# Patient Record
Sex: Female | Born: 1970 | Hispanic: Refuse to answer | Smoking: Never smoker
Health system: Southern US, Community
[De-identification: ages and names within clinical notes are randomized; demographics above are authoritative.]

## PROBLEM LIST (undated history)

## (undated) DIAGNOSIS — E039 Hypothyroidism, unspecified: Secondary | ICD-10-CM

## (undated) DIAGNOSIS — F319 Bipolar disorder, unspecified: Secondary | ICD-10-CM

## (undated) DIAGNOSIS — L508 Other urticaria: Secondary | ICD-10-CM

## (undated) DIAGNOSIS — M5030 Other cervical disc degeneration, unspecified cervical region: Secondary | ICD-10-CM

## (undated) DIAGNOSIS — F419 Anxiety disorder, unspecified: Secondary | ICD-10-CM

## (undated) DIAGNOSIS — F32A Depression, unspecified: Secondary | ICD-10-CM

## (undated) DIAGNOSIS — J45909 Unspecified asthma, uncomplicated: Secondary | ICD-10-CM

## (undated) DIAGNOSIS — M5136 Other intervertebral disc degeneration, lumbar region: Secondary | ICD-10-CM

## (undated) DIAGNOSIS — M797 Fibromyalgia: Secondary | ICD-10-CM

## (undated) DIAGNOSIS — N200 Calculus of kidney: Secondary | ICD-10-CM

## (undated) DIAGNOSIS — G43909 Migraine, unspecified, not intractable, without status migrainosus: Secondary | ICD-10-CM

## (undated) DIAGNOSIS — L4050 Arthropathic psoriasis, unspecified: Secondary | ICD-10-CM

## (undated) DIAGNOSIS — M51369 Other intervertebral disc degeneration, lumbar region without mention of lumbar back pain or lower extremity pain: Secondary | ICD-10-CM

## (undated) DIAGNOSIS — I7300 Raynaud's syndrome without gangrene: Secondary | ICD-10-CM

## (undated) DIAGNOSIS — F410 Panic disorder [episodic paroxysmal anxiety] without agoraphobia: Secondary | ICD-10-CM

## (undated) DIAGNOSIS — F329 Major depressive disorder, single episode, unspecified: Secondary | ICD-10-CM

## (undated) DIAGNOSIS — R569 Unspecified convulsions: Secondary | ICD-10-CM

## (undated) HISTORY — PX: TONSILLECTOMY AND ADENOIDECTOMY: SUR1326

## (undated) HISTORY — DX: Anxiety disorder, unspecified: F41.9

## (undated) HISTORY — PX: CERVICAL SPINE SURGERY: SHX589

## (undated) HISTORY — PX: BREAST ENHANCEMENT SURGERY: SHX7

## (undated) HISTORY — DX: Major depressive disorder, single episode, unspecified: F32.9

## (undated) HISTORY — PX: OTHER SURGICAL HISTORY: SHX169

## (undated) HISTORY — DX: Depression, unspecified: F32.A

## (undated) HISTORY — PX: ABDOMINAL HYSTERECTOMY: SHX81

## (undated) HISTORY — PX: LUMBAR SPINE SURGERY: SHX701

## (undated) HISTORY — PX: PARTIAL HYSTERECTOMY: SHX80

## (undated) HISTORY — PX: RHINOPLASTY: SUR1284

---

## 1998-01-26 ENCOUNTER — Ambulatory Visit (HOSPITAL_COMMUNITY): Admission: RE | Admit: 1998-01-26 | Discharge: 1998-01-26 | Payer: Self-pay | Admitting: Specialist

## 2004-08-15 ENCOUNTER — Inpatient Hospital Stay (HOSPITAL_COMMUNITY): Admission: AD | Admit: 2004-08-15 | Discharge: 2004-08-17 | Payer: Self-pay | Admitting: Internal Medicine

## 2004-08-16 ENCOUNTER — Encounter (INDEPENDENT_AMBULATORY_CARE_PROVIDER_SITE_OTHER): Payer: Self-pay | Admitting: Specialist

## 2009-01-11 ENCOUNTER — Observation Stay (HOSPITAL_COMMUNITY): Admission: EM | Admit: 2009-01-11 | Discharge: 2009-01-29 | Payer: Self-pay | Admitting: Internal Medicine

## 2010-10-16 LAB — BASIC METABOLIC PANEL
BUN: 5 mg/dL — ABNORMAL LOW (ref 6–23)
BUN: 7 mg/dL (ref 6–23)
CO2: 30 mEq/L (ref 19–32)
CO2: 30 mEq/L (ref 19–32)
CO2: 31 mEq/L (ref 19–32)
Calcium: 8.9 mg/dL (ref 8.4–10.5)
Calcium: 9.4 mg/dL (ref 8.4–10.5)
Chloride: 96 mEq/L (ref 96–112)
Chloride: 93 mEq/L — ABNORMAL LOW (ref 96–112)
Creatinine, Ser: 0.67 mg/dL (ref 0.4–1.2)
Creatinine, Ser: 0.77 mg/dL (ref 0.4–1.2)
GFR calc Af Amer: >60 mL/min (ref >60)
GFR calc Af Amer: >60 mL/min (ref >60)
GFR calc non Af Amer: >60 mL/min (ref >60)
Glucose, Bld: 115 mg/dL — ABNORMAL HIGH (ref 70–99)
Glucose, Bld: 118 mg/dL — ABNORMAL HIGH (ref 70–99)
Potassium: 4.2 mEq/L (ref 3.5–5.1)
Sodium: 124 mEq/L — ABNORMAL LOW (ref 135–145)

## 2010-10-16 LAB — DIFFERENTIAL
Basophils Absolute: 0 10*3/uL (ref 0.0–0.1)
Basophils Relative: 0 % (ref 0–1)
Eosinophils Absolute: 0.2 10*3/uL (ref 0.0–0.7)
Monocytes Absolute: 0.6 10*3/uL (ref 0.1–1.0)
Monocytes Relative: 9 % (ref 3–12)
Neutro Abs: 4.2 10*3/uL (ref 1.7–7.7)
Neutrophils Relative %: 64 % (ref 43–77)

## 2010-10-16 LAB — CBC
HCT: 31.1 % — ABNORMAL LOW (ref 36.0–46.0)
HCT: 35.8 % — ABNORMAL LOW (ref 36.0–46.0)
Hemoglobin: 10.2 g/dL — ABNORMAL LOW (ref 12.0–15.0)
MCHC: 33.7 g/dL (ref 30.0–36.0)
MCHC: 34.4 g/dL (ref 30.0–36.0)
MCV: 86.1 fL (ref 78.0–100.0)
MCV: 86.1 fL (ref 78.0–100.0)
MCV: 86.8 fL (ref 78.0–100.0)
Platelets: 155 10*3/uL (ref 150–400)
Platelets: 182 10*3/uL (ref 150–400)
RBC: 3.61 MIL/uL — ABNORMAL LOW (ref 3.87–5.11)
RBC: 4.13 MIL/uL (ref 3.87–5.11)
RDW: 13.4 % (ref 11.5–15.5)
RDW: 13.4 % (ref 11.5–15.5)
WBC: 6.5 10*3/uL (ref 4.0–10.5)

## 2010-10-16 LAB — COMPREHENSIVE METABOLIC PANEL
ALT: 16 U/L (ref 0–35)
ALT: 28 U/L (ref 0–35)
ALT: 34 U/L (ref 0–35)
AST: 10 U/L (ref 0–37)
AST: 13 U/L (ref 0–37)
AST: 14 U/L (ref 0–37)
Albumin: 3.2 g/dL — ABNORMAL LOW (ref 3.5–5.2)
Albumin: 3.3 g/dL — ABNORMAL LOW (ref 3.5–5.2)
Albumin: 3.6 g/dL (ref 3.5–5.2)
Albumin: 3.8 g/dL (ref 3.5–5.2)
Albumin: 4 g/dL (ref 3.5–5.2)
Albumin: 4.1 g/dL (ref 3.5–5.2)
Alkaline Phosphatase: 50 U/L (ref 39–117)
Alkaline Phosphatase: 65 U/L (ref 39–117)
Alkaline Phosphatase: 72 U/L (ref 39–117)
BUN: 6 mg/dL (ref 6–23)
BUN: 10 mg/dL (ref 6–23)
BUN: 4 mg/dL — ABNORMAL LOW (ref 6–23)
BUN: 4 mg/dL — ABNORMAL LOW (ref 6–23)
BUN: 7 mg/dL (ref 6–23)
BUN: 9 mg/dL (ref 6–23)
CO2: 26 mEq/L (ref 19–32)
CO2: 29 mEq/L (ref 19–32)
CO2: 30 mEq/L (ref 19–32)
CO2: 30 mEq/L (ref 19–32)
Calcium: 8.7 mg/dL (ref 8.4–10.5)
Calcium: 8.7 mg/dL (ref 8.4–10.5)
Chloride: 92 mEq/L — ABNORMAL LOW (ref 96–112)
Chloride: 102 mEq/L (ref 96–112)
Chloride: 92 mEq/L — ABNORMAL LOW (ref 96–112)
Chloride: 92 mEq/L — ABNORMAL LOW (ref 96–112)
Chloride: 93 mEq/L — ABNORMAL LOW (ref 96–112)
Chloride: 95 mEq/L — ABNORMAL LOW (ref 96–112)
Chloride: 99 mEq/L (ref 96–112)
Creatinine, Ser: 0.63 mg/dL (ref 0.4–1.2)
Creatinine, Ser: 0.65 mg/dL (ref 0.4–1.2)
Creatinine, Ser: 0.7 mg/dL (ref 0.4–1.2)
Creatinine, Ser: 0.73 mg/dL (ref 0.4–1.2)
Creatinine, Ser: 0.81 mg/dL (ref 0.4–1.2)
Creatinine, Ser: 0.86 mg/dL (ref 0.4–1.2)
GFR calc Af Amer: >60 mL/min (ref >60)
GFR calc Af Amer: >60 mL/min (ref >60)
GFR calc non Af Amer: >60 mL/min (ref >60)
GFR calc non Af Amer: >60 mL/min (ref >60)
GFR calc non Af Amer: >60 mL/min (ref >60)
GFR calc non Af Amer: >60 mL/min (ref >60)
GFR calc non Af Amer: >60 mL/min (ref >60)
GFR calc non Af Amer: >60 mL/min (ref >60)
Glucose, Bld: 96 mg/dL (ref 70–99)
Glucose, Bld: 108 mg/dL — ABNORMAL HIGH (ref 70–99)
Glucose, Bld: 88 mg/dL (ref 70–99)
Potassium: 2.9 mEq/L — ABNORMAL LOW (ref 3.5–5.1)
Potassium: 4 mEq/L (ref 3.5–5.1)
Potassium: 4.2 mEq/L (ref 3.5–5.1)
Potassium: 4.2 mEq/L (ref 3.5–5.1)
Sodium: 131 mEq/L — ABNORMAL LOW (ref 135–145)
Sodium: 130 mEq/L — ABNORMAL LOW (ref 135–145)
Sodium: 133 mEq/L — ABNORMAL LOW (ref 135–145)
Total Bilirubin: 0.3 mg/dL (ref 0.3–1.2)
Total Bilirubin: 0.3 mg/dL (ref 0.3–1.2)
Total Bilirubin: 0.4 mg/dL (ref 0.3–1.2)
Total Bilirubin: 0.4 mg/dL (ref 0.3–1.2)
Total Bilirubin: 0.4 mg/dL (ref 0.3–1.2)
Total Bilirubin: 0.5 mg/dL (ref 0.3–1.2)
Total Bilirubin: 0.5 mg/dL (ref 0.3–1.2)
Total Protein: 5.7 g/dL — ABNORMAL LOW (ref 6.0–8.3)
Total Protein: 5.8 g/dL — ABNORMAL LOW (ref 6.0–8.3)

## 2010-10-16 LAB — OSMOLALITY, URINE: Osmolality, Ur: 318 mOsm/kg — ABNORMAL LOW (ref 390–1090)

## 2010-10-16 LAB — HEPATITIS B E ANTIBODY: Hep B E Ab: NEGATIVE

## 2010-10-16 LAB — H. PYLORI ANTIBODY, IGG: H Pylori IgG: <0.4 {ISR}

## 2010-10-16 LAB — URINE MICROSCOPIC-ADD ON

## 2010-10-16 LAB — URINALYSIS, ROUTINE W REFLEX MICROSCOPIC
Bilirubin Urine: NEGATIVE
Bilirubin Urine: NEGATIVE
Bilirubin Urine: NEGATIVE
Glucose, UA: NEGATIVE mg/dL
Hgb urine dipstick: NEGATIVE
Ketones, ur: NEGATIVE mg/dL
Ketones, ur: NEGATIVE mg/dL
Nitrite: NEGATIVE
Nitrite: NEGATIVE
Specific Gravity, Urine: 1.005 (ref 1.005–1.030)
Specific Gravity, Urine: 1.014 (ref 1.005–1.030)
Specific Gravity, Urine: 1.021 (ref 1.005–1.030)
Urobilinogen, UA: 0.2 mg/dL (ref 0.0–1.0)
Urobilinogen, UA: 0.2 mg/dL (ref 0.0–1.0)
pH: 6 (ref 5.0–8.0)
pH: 7.5 (ref 5.0–8.0)

## 2010-10-16 LAB — URINE CULTURE
Colony Count: NO GROWTH
Colony Count: NO GROWTH
Special Requests: POSITIVE

## 2010-10-16 LAB — HEMOGLOBIN A1C: Mean Plasma Glucose: 111 mg/dL

## 2010-10-16 LAB — AMYLASE
Amylase: 151 U/L — ABNORMAL HIGH (ref 27–131)
Amylase: 176 U/L — ABNORMAL HIGH (ref 27–131)

## 2010-10-16 LAB — TSH: TSH: 1.389 u[IU]/mL (ref 0.350–4.500)

## 2010-10-16 LAB — THYROID PEROXIDASE ANTIBODY: Thyroperoxidase Ab SerPl-aCnc: 47 U/mL (ref 0.0–60.0)

## 2010-10-16 LAB — LIPASE, BLOOD
Lipase: 55 U/L (ref 11–59)
Lipase: 90 U/L — ABNORMAL HIGH (ref 11–59)

## 2010-10-16 LAB — IRON AND TIBC
Iron: 102 ug/dL (ref 42–135)
Saturation Ratios: 33 % (ref 20–55)
TIBC: 312 ug/dL (ref 250–470)
UIBC: 210 ug/dL

## 2010-10-16 LAB — C1 ESTERASE INHIBITOR, FUNCTIONAL: C1INH Functional/C1INH Total MFr SerPl: 95 % (ref >=68)

## 2010-10-16 LAB — C4 COMPLEMENT: Complement C4, Body Fluid: 15 mg/dL — ABNORMAL LOW (ref 16–47)

## 2010-10-16 LAB — SODIUM, URINE, RANDOM: Sodium, Ur: 32 mEq/L

## 2010-10-16 LAB — LIPID PANEL: Triglycerides: 82 mg/dL (ref <150)

## 2010-10-16 LAB — EXTRACTABLE NUCLEAR ANTIGEN ANTIBODY: SSB (La) (ENA) Antibody, IgG: <0.2 AI (ref <1.0)

## 2010-10-16 LAB — HIV ANTIBODY (ROUTINE TESTING W REFLEX): HIV: NONREACTIVE

## 2010-10-16 LAB — ANTI-DNA ANTIBODY, DOUBLE-STRANDED: ds DNA Ab: <1 IU/mL (ref <5)

## 2010-10-16 LAB — SEDIMENTATION RATE: Sed Rate: 7 mm/hr (ref 0–22)

## 2010-10-16 LAB — ROCKY MTN SPOTTED FVR AB, IGM-BLOOD: RMSF IgM: 0.44 IV (ref 0.00–0.89)

## 2010-10-16 LAB — ROCKY MTN SPOTTED FVR AB, IGG-BLOOD: RMSF IgG: 0.18 IV

## 2010-10-16 LAB — CYTOMEGALOVIRUS ANTIBODY, IGG: Cytomegalovirus(CMV) Antibody, IgG: NEGATIVE

## 2010-11-22 NOTE — Consult Note (Signed)
[name], [name]          ACCOUNT NO.:  [id number]   MEDICAL RECORD NO.:  [id number]          PATIENT TYPE:  INP   LOCATION:  3019                         FACILITY:  [facility]   PHYSICIAN:  Casimiro Needle L. Reynolds, M.D.DATE OF BIRTH:  1970-07-22   DATE OF CONSULTATION:  01/12/2009  DATE OF DISCHARGE:                                 CONSULTATION   CHIEF COMPLAINT:  Weakness and decreased sensation on bilateral upper  extremities.   REFERRING PHYSICIAN:  Eric L. August Saucer, MD   HISTORY OF PRESENT ILLNESS:  This is a pleasant Caucasian 40 year old  female with a chief complaint of increased weakness and urticaria in the  right upper extremity and left upper extremity.  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 August Luz in [facility]  Reason for consultation today is left upper extremity weakness  and bilateral hypesthesia in the wrist.  The patient states that this  pain and discomfort started approximately 2 years ago without any known  etiology.  The patient works as a Geologist, engineering in [employer] and  does a lot of lifting and pulling on a daily basis.  She states that the  discomfort started in the left neck region and radiated to both the left  occiput and left trapezius.  Over time the patient started to notice  that bilaterally with left greater than right.  She had hand  hypesthesia.  It was prominently in the palms and the tips of her  fingers.  She states that there was no specific dermatomal distribution  to which this hypesthesia occurred and no specific position to which she  put her arms or her neck that caused this sensation.  Over the past 3  months, the patient has started to also drop objects, again with her  left greater than her right and noted that she was having difficulty  opening bottles and cans.  It should be noted the patient is a right-  handed female.  Her main complaint today is the pain that is located in  the left  occiput, left neck, and left trapezial region.  She is not  complaining of any weakness with shoulder abduction, bicep flexion or  extension.  She does note that she is having difficulty with her grip  bilaterally and feels as though she has a burning sensation that is  located in the left upper arm region.   The patient states that in the past she has tried cervical traction.  She has tried acupuncture.  She is tried multiple muscle relaxants and  multiple visits to physical therapy without any resolution.   PAST MEDICAL HISTORY:  Depression, cholecystectomy in 2006 for biliary  dyskinesia, status post laparoscopic abdominal hysterectomy.  Her  ovaries are still intact.  She has had saline implants x2, last being in  1997, chronic urticaria, and asthma.   MEDICATIONS:  1. Xanax 1.5 mg t.i.d.  2. Sinequan 75 mg at bedtime.  3. Pepcid 20 mg b.i.d.  4. Loratadine 10 mg daily.  5. Morphine 15 mg b.i.d.  6. Protonix 40 mg daily.  7.  Restoril 30 mg at bedtime.   ALLERGIES:  SULFA, SERTRALINE, DIPHENHYDRAMINE, CONTRAST MEDIA, CODEINE,  CYMBALTA, LYRICA, and PERCOCET.   SOCIAL HISTORY:  The patient does not smoke, drink, or do illicit drugs.  She has 3 children.  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   REVIEW OF SYSTEMS:  Negative.   PHYSICAL EXAMINATION:  VITAL SIGNS:  Blood pressure is 103/69, pulse 66,  respiratory rate 18, and temperature 98.1.  MENTAL STATUS:  She is alert, she is oriented x3.  She carries out 2 and  3 step commands without any difficulty.  PULMONARY:  Clear to auscultation bilaterally.  CARDIOVASCULAR:  S1 and S2.  Regular rate and rhythm.  NECK:  Negative for bruits and supple.  ABDOMEN:  Soft, nontender, and nondistended.  Bowel sounds in all 4  quadrants.  NEUROLOGIC:  Cranial Nerves:  Pupils are equal, round, reactive, and  accommodating to light.  Conjugate gaze.  Extraocular muscles are  intact.  Visual fields intact.  Face is symmetric.   Tongue is midline.  Uvula is midline.  Sensation bilaterally V1 through V3 is full to  pinprick and light touch.  Shoulder shrug, head turn, full range of  motion.  The patient does state propping her head to the right, does  cause slight discomfort in her right arm and propping her head to the  left causes slight discomfort in her left arm, at this point, right is  greater than left.  The patient has a negative Lhermitte sign and  negative Tinel sign, and negative Ballance sign.  Coordination finger-to-  nose smooth, heal-to-shin smooth, fine motor movements are equal  bilaterally.  Gait is within normal limits.  MOTOR:  She has 5/5 strength globally.  No tremor or asterixis.  No  muscle wasting, no decreased tone throughout.  The patient's tone and  bulk are within normal limits.   Drift negative bilaterally.   Deep tendon reflexes are 2+ throughout.  Downgoing toes bilaterally.   Sensation subjectively, she has decreased sensation bilateral palm  greater in the distal aspects of her fingers.  She also has decreased  sensation circumferentially around bilateral wrist.  Otherwise, she has  normal pinprick, vibration, proprioception, stereognosis, or  graphesthesia.   LABORATORY DATA:  ESR is 7, TSH 1.389.  Anemia study all showed within  normal limits.  Hep C was negative.  C3 complement was 77, C4 complement  15.  CRP was negative.  UA shows positive for nitrites, but no bacteria.  Sodium 133, potassium 2.9, chloride 99, CO2 26, BUN 4, creatinine 0.86,  glucose 100.  White blood cell count 6.5, platelets 155, hemoglobin and  hematocrit 10.6 and 31.1.  Rheumatoid factor negative.  ANA negative.   IMAGING AND TEST:  MRI of the brain shows normal study for the patient's  age.  MRI of C-spine shows C4-5 large right foraminal disk extrusion  complex with severe right C5 foraminal stenosis.  No cervical spine  stenosis noted.   IMPRESSION:  At this time, this is a 40 year old female  with neck pain,  most likely musculoskeletal.  However, the patient does have C4-5  herniated nucleus pulposus, but no clinical evidence of radicular pain.  At this time, we will recommend Zanaflex 2 mg at bedtime and after 1  week, increase 2 mg b.i.d.  PT for eval and treat and neurosurgical  evaluation, we will follow up p.r.n.     ______________________________  Felicie Morn, PA-C  Michael L. Thad Ranger, M.D.  Electronically Signed    DS/MEDQ  D:  01/12/2009  T:  01/13/2009  Job:  [id number]   cc:   Minerva Areola L. August Saucer, M.D.  Michael L. Thad Ranger, M.D.

## 2010-11-22 NOTE — Consult Note (Signed)
[name], SCHREIER          ACCOUNT NO.:  [id number]   MEDICAL RECORD NO.:  [id number]          PATIENT TYPE:  INP   LOCATION:  NA                           [facility]ACILITY[facility]  [facility]   PHYSICIAN:  Antonietta Breach, M.D.  DATE OF BIRTH:  [birth year]   DATE OF CONSULTATION:  01/25/2009  DATE OF DISCHARGE:                                 CONSULTATION   Ms. Murrill paged and roamed all night.  She did not sleep.  She has  been experiencing euphoric mood and some racing thoughts.  She is  redirectable and cooperative.  She is not combative.  She is not having  any hallucinations.   Her orientation and memory function are intact.  She states that her  mood is giddy.   She still has muscle tension and feeling on edge.  Please see the  previous discussion.   REVIEW OF SYSTEMS:  CARDIOVASCULAR:  Her EKG QTC was less than 500 ms.  NEUROLOGIC:  She is not having any stiffness or other extrapyramidal  side effects with the Seroquel.   LABORATORY DATA:  Amylase is elevated at 179, lipase is elevated at 90.  WBC 11.7.   The case was discussed with staff.   EXAMINATION:  VITAL SIGNS:  Temperature 97.5, pulse 82, respiratory rate  16, blood pressure 128/66, O2 saturation on room air 99%.  MENTAL STATUS EXAM:  Ms. Buttrey is alert.  Her eye contact is intact.  Her attention span is mildly decreased.  She does interrupt often.  Her  concentration is mildly decreased.  Her affect is mildly expansive.  Mood is euphoric.  She has intact memory and orientation function.  Her  speech is mildly pressured, increased speed.  Thought process - she has  some grandiosity.  Her insight is intact.  Her judgment is still  impaired for self-care outside the hospital.   ASSESSMENT:  AXIS I:  1. 293.84 - anxiety disorder not otherwise specified.  2. 296.80 - bipolar disorder, manic.   RECOMMENDATIONS:  1. Would increase the Seroquel to 200 mg q.h.s. for anti-psychosis,      anti-mania and mood  stabilization.  Would continue to monitor for      stiffness or other extrapyramidal side effects.  2. Would check an EKG again and discontinue the Seroquel if the QTC is      greater than 500 ms.  3. Would increase her Xanax back to 1 mg t.i.d. with it dose at q.h.s.  4. Would discontinue using Remeron since it may be contributing to her      persistent manic symptoms.  5. Would continue Restoril 30 mg q.h.s. p.r.n. insomnia one hour after      her q.h.s. medicines.  6. Transfer to a medical psychiatric unit would be ideal at this      point, given her ongoing general medical conditions and her      comorbid psychiatric symptoms.   If she is cleared from a general medical perspective, she would be a  candidate for an inpatient psychiatric unit.      Antonietta Breach, M.D.  Electronically Signed     JW/MEDQ  D:  01/25/2009  T:  01/25/2009  Job:  [id number]

## 2010-11-22 NOTE — Consult Note (Signed)
[name], [name]          ACCOUNT NO.:  [id number]   MEDICAL RECORD NO.:  [id number]          PATIENT TYPE:  INP   LOCATION:  3019                         FACILITY:  [facility]   PHYSICIAN:  Zenovia Jordan, MD      DATE OF BIRTH:  06-19-1971   DATE OF CONSULTATION:  01/13/2009  DATE OF DISCHARGE:                                 CONSULTATION   HISTORY OF PRESENT ILLNESS:  A 40 year old female with past medical  history of asthma, psoriasis diagnosed at age 14, fibromyalgia, allergic  rhinitis and chronic urticaria diagnosed 8 years ago based on skin  biopsy; now in [facility] with main concern of weakness and  urticaria.  Episode of urticaria started at the beginning of June 2010.  It has been getting slightly better.  No specific aggravating factors,  associated with occasional shortness of breath.  She has tried numerous  agents such as Zyrtec and Xylol with moderate success.  Urticaria mostly  occurs on the back area and legs.  She describes hives as round, red,  fire-like burning sensation, itching, about a half centimeter in size  and getting bigger, worse with movement and activity.  Last similar  episode was about 3 years ago which lasted 3 to 4 months.  She has had a  total of 4 to 5 episodes since her diagnosis in 2002.  In terms of her  weakness, mostly upper extremity and associated with numbness and  tingling.  The patient was recently started on Lyrica at the end of May  2010.  The patient also reports dysphagia, dry mouth, occasional oral  ulcers, mostly provoked by stress and generalized aches, fingers turning  cold and blue.   ALLERGIES:  SULFA, CITRULLINE, DIPHENHYDRAMINE, CONTRAST, CODEINE,  PERCOCET, ZOLOFT, LYRICA, CYMBALTA, LEXAPRO - all medicines give hives.   HOME MEDICATIONS:  Claritin, Xyzal, Prevacid, Vistaril, Elocon, Advair,  Albuterol, Xopenex, Epi-Pen, MSIR 15 mg twice daily, Ativan and Soma.   FAMILY MEDICAL HISTORY:  Psoriasis on dad's  side.   SOCIAL HISTORY:  Nonsmoker, nondrinker.  Nurse.  Currently unemployed.  Divorced, from [place].   PAST MEDICAL HISTORY:  Per HPI.  In addition.  1. GERD.  2. Cholecystectomy secondary to biliary dyskinesia in 2006.  3. Hysterectomy.  4. Depression.  5. Bipolar disorder.   REVIEW OF SYSTEMS:  Generalized weakness, joint pain, specifically  wrists, arms, neck, hips.   PHYSICAL EXAMINATION:  VITALS:  Temperature 97.8, blood pressure 114/70,  pulse 80, respirations 16, saturating 100% on room air.  GENERAL:  The patient is sitting in bed not in acute distress.  HEENT:  PERRLA.  EOMI.  No scleral icterus.  No conjunctival pallor.  No  oral ulcers or pharyngeal erythema.  Moist mucous membranes.  No visible  nasal polyps.  NECK:  No palpable lymphadenopathy.  No thyroid enlargement.  LUNGS:  Clear to auscultation bilaterally.  No wheezing.  CARDIOVASCULAR SYSTEM:  Regular rate and rhythm.  S1, S2 present.  No  murmurs.  ABDOMEN:  Soft, nondistended, nontender.  No guarding, no rebound.  Bowel sounds present.  SKIN:  Multiple lesions on the back,  arms, a few on the lower  extremities with excoriation, somewhat round, half centimeter to one  centimeter in diameter with red border and yellowish to whitish central  area.  Tender to palpation.  Chronic scratching marks.  MUSCULOSKELETAL SYSTEM:  No joint edema or effusions on upper or lower  extremities bilaterally.  Full range of motion throughout.  Tenderness  to palpation in MCP joints bilaterally, right more than left.  No  redness over the joints.  No warmth to touch.  NEUROLOGIC EXAM:  Alert, oriented x3.  Strength 5/5 throughout in lower  extremities, decreased strength in upper extremities.  Sensation intact  as well as touch.  Overall nonfocal exam.   LABS:  Sodium 137, potassium 4.0, chloride 102, bicarb 29, BUN 4,  creatinine 0.81, glucose 88.  WBC 6.8, hemoglobin 12.1, hematocrit 35.8,  platelets 182.  T bili  0.3, alkaline phosphatase 63, AST 24, ALT 37,  total protein 6.7, albumin 3.8, calcium 9.1.  Anti-DNA less than 1.  ANA  negative.  ESR 7.  CRP 0.  HIV nonreactive.  TSH 1.389.  Hepatitis C  negative.  RF less than 20.  C3 77 (88-201 normal range).  C4 15 (16-47  normal range).  UA positive for nitrites.  Urine culture negative.  Chest x-ray January 11, 2009:  No active disease.  MRI of the head with and  without contrast January 11, 2009:  Normal appearance of the brain.  MRI of  the cervical spine with and without contrast January 11, 2009:  Large right  foraminal extrusion disk osteophyte, C5 neuroforaminal stenosis present.   ASSESSMENT/PLAN:  A 40 year old female with diffuse fibromyalgia with  tender points and complaints.  Anxiety, almost manic in conversation.  A  few lesions on the back that have extensive excoriations.  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  Will try to  get records from prior allergist/immunologist.  Possible flare this time  due to Lyrica given for fibromyalgia pain.  Patient also with diffuse  fibromyalgia symptoms that complicate history.  I will start a  Prednisone taper 40 mg p.o. Prednisone daily for 2 days, then 30 mg  tablet p.o. for 2 days, then 20 mg tablet p.o. daily for one week, then  15 mg tablet p.o. daily for one week, then 10 mg tablet daily for one  week, then 5 mg tablet p.o. daily for one week and then stop.  The  patient can follow up with me as an outpatient in the next month or so.  She also will try and find an urticaria expert in the area.  As for her  fibromyalgia, she has tried and failed multiple medications secondary to  lack of efficacy or hives and bad reaction.  I recommend nonmedical  treatment - acupuncture, exercise, physical therapy.  Discussed  narcotics make fibromyalgia patients more sensitive to pain and should  be avoided,  particularly in this patient who states, It doesn't matter  if I'm addicted as I will have this forever.  Continue muscle  relaxants, Tramadol, sleeping medication.  Continue antihistamines and H-  2 blocker.  Call my office for an appointment upon discharge.   Thank for this interesting consult, please call with further questions.  Over 45 minutes spent on consultation in total.      Mliss Sax, MD  Electronically Signed      Zenovia Jordan, MD  Electronically Signed  IM/MEDQ  D:  01/13/2009  T:  01/13/2009  Job:  [id number]   cc:   Zenovia Jordan, MD

## 2010-11-22 NOTE — H&P (Signed)
[name], [name]          ACCOUNT NO.:  [id number]   MEDICAL RECORD NO.:  [id number]          PATIENT TYPE:  INP   LOCATION:  3019                         FACILITY:  [facility]   PHYSICIAN:  Eric L. August Saucer, M.D.     DATE OF BIRTH:  07-03-1971   DATE OF ADMISSION:  01/11/2009  DATE OF DISCHARGE:                              HISTORY & PHYSICAL   CHIEF COMPLAINT:  Refractory urticaria, progressive upper extremity  weakness, and anorexia.   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  The patient has history of chronic urticaria for greater  than 5 years.  She had been treated by her allergist in [facility], a  Dr. Rush Barer.  She has had skin biopsies which confirmed a chronic  urticaria with associated urticaria vasculitis.  There was a question of  possible cold induced urticaria as well.  The patient had been on a  number of agents with moderate success.  She, however, on December 20, 2008  developed an upper respiratory infection with a temperature spiking to  103.  This was followed by an exacerbation of hives with associated  difficulty breathing and associated asthmatic bronchitis.  The patient  did undergo antibiotic therapy.  She also used her inhalers with gradual  improvement of her breathing.  She, however, has continued to have  recurrent urticaria on a daily basis.  She has taken several medications  which have caused excessive drowsiness as well.  The patient notably has  episodes that exacerbate at night.  They occur mostly on arms and legs.  She has occasionally had exacerbation of asthma during these attacks as  well.  She carries an EpiPen.   The patient also had increasing problems with upper extremity weakness  to the point of dropping objects when carrying pots and pans.  She notes  bilateral tingling in her hands as well as forearms.  She has been  diagnosed in the past as having fibromyalgia.  She also has had history  of bulging disk for which she has undergone several treatments including  Lyrica and Cymbalta.  These agents have exacerbated her hives as well.  She has also been on Lexapro and Zoloft with similar results.  The  patient currently has been unable to work due to increasing neck pain  and dysesthesias into her arms.  She most recently had been on MS SR per  her neurologist.  She has attempted to wean herself off this medication  to once a day.  She, however, continues to have significant neck pain.   The patient has had intermittent dysphagia.  She has not been eating  consistently with progressive weight loss.  She acknowledges ongoing  stressors as well.  With progression of the above-noted symptoms, the  patient is admitted at this time for further evaluation.   PAST MEDICAL HISTORY:  As noted above.  She has had a cholecystectomy in  2006 for biliary dyskinesia.  She is status post  laparoscopic abdominal  hysterectomy.  Ovaries are still intact.  This was in November 2009.  Status post T and A in 1988.  She has had saline implants x2 last being  in 1997.   The patient has had a broken nose x2.  Fracture of the second and fourth  fingers on the right in the past as well.   Past medical history otherwise remarkable for psoriasis since age of  [age].  She has had allergic rhinitis as well as asthma with  intermittent exacerbation as noted.   REVIEW OF SYSTEMS:  As noted above.  She has had three MRI scans in the  past documenting bulging disk.  This was felt to be associated with her  refractory neck pain and headaches as well.  The patient has been  evaluated by Dr. Maryella Shivers of [facility], neurologist.  She has had  nerve conduction studies as well.  She has not be given a specific  diagnosis by the patient's report.  No recent MRI scan of  the head.   ALLERGIES:  SULFA, CODEINE, and HYDROCODONE.  She is allergic to  [facility] and possibly IV CONTRAST as well.  The patient has had poor  tolerance to WINE, CHEESE, and STRAWBERRIES.  She is also allergic to  COLD WAX, i.e. which causes asthma exacerbation.   The patient has poorly tolerated LYRICA, CYMBALTA, LEXAPRO, and ZOLOFT  as noted as well.   CURRENT MEDICATIONS:  MS SR 15 mg b.i.d.  The patient has been taking  Claritin 10 mg b.i.d.  Briefly tried Xyzal once a day with incomplete  relief.  The patient has been on Prevacid of 30 mg b.i.d., Vistaril 25-  50 mg q.i.d., and Xanax 1 mg b.i.d.  Elocon cream for psoriasis.  She  was recently recommended to try Sinequan which she has not taken.  She  also has Advair 250/50 one puff b.i.d.  Ventolin and/or Xopenex inhaler  q.4 h. p.r.n. asthma attack and EpiPen as noted.   PHYSICAL EXAMINATION:  GENERAL:  She is a well-developed, slender white  female extremely anxious presently complaining of itching and hives.  VITAL SIGNS:  Blood pressure of 114/70, pulse of 77, respiratory rate of  16, temperature 98.6, and O2 sats 99% on room air.  HEENT:  Head:  Normocephalic and atraumatic without bruits.  Extraocular  muscles are intact.  Pupils notably right pupil is at 3 mm diameter,  left pupil at 2 mm.  Both pupils are reacting to light.  There is no  sinus tenderness.  TMs with decreased light reflex without erythematous  changes.  Throat:  Posterior pharyngeal erythema.  Good dental repair.  NECK:  Supple without enlarged thyroid.  No positive cervical nodes.  She does have prominent trapezoid muscles, left greater than right to  direct palpation.  This extends up into the scalp causing some headaches  of pressure.  LUNGS:  Clear to auscultation.  No E to A changes.  CARDIOVASCULAR:  Normal S1-S2.  No S3, S4, murmurs, or rubs.  ABDOMEN:  Bowel sounds are present.  No masses or tenderness.  Well-  healed surgical scars.   SKIN:  Notable for scattered excoriations in her back, arms, knees, and  legs.  No pustular or purulent changes noted.  NEUROLOGIC:  She is alert, oriented to person, place, and time.  Somewhat pressured speech.  She has mild dizziness to right lateral  gaze.  Slight left foot drift on confrontation.  DTRs are 1+ and  symmetric.  Strength is 4/5 bilaterally.  She does have positive Tinel  sign bilaterally as well.  Otherwise intact.   LABORATORY DATA:  CBC revealed WBC of 6500, hemoglobin 10.6, and  hematocrit of 31.1.  Normal differential and 3% eosinophils.  Electrolytes:  Sodium 133, potassium 2.9, chloride 99, CO2 26, BUN 4,  creatinine 0.86, and glucose 100.  SGOT 13, SGPT 13, total protein 6.1,  albumin 3.3 low, calcium 8.4, and magnesium 1.7.  Amylase 47.  Urinalysis:  PH of 7.5, specific gravity of 1.021, dipstick negative,  trace protein, and trace nitrates.   Chest x-ray shows clear lung fields.  Normal heart size.  Cervical spine  demonstrates normal alignment with disk space well preserved.  No  evidence for subluxation or soft tissue swelling.  Abdominal film shows  unremarkable bowel gas pattern as well.   IMPRESSION:  1. Chronic urticaria, poorly controlled.  This has been exacerbated by      multiple factors.  We will need to rule out reversible causes.  2. Upper extremity dysesthesias affecting the hands with increasing      weakness on a sporadic basis.  Rule out shoulder-hand syndrome with      atypical presentation.  Rule out foraminal stenosis versus other.      This notably has not been borne out by x-rays.  3. Rule out multiple sclerosis versus other.  4. Questionable history of fibromyalgia.  5. Hypokalemia, possibly symptomatic.  Rule out gastrointestinal      versus renal losses.  6. Malnutrition.  This is multifactorial.  7. Atypical headaches with visual disturbance.  Rule out intracranial      process.  8. History of bipolar disease.  Presently on no  medications for this      at this time.  She has responded well to Seroquel.  She has had      adverse reaction to other medications in the past.   PLAN:  The patient is admitted for further evaluation.  We will do a  comprehensive screening for urticaria.  We will also obtain stools for O  and P as well.  ANA, ENA, C3, C4, thyroid panel, hepatitis B and C has  been ordered.  We will obtain neurological opinion after an MRI scan of  her head and neck has been obtained.  We will also get an opinion from  Rheumatology regarding fibromyalgia.  We will now continue her on Xyzal  b.i.d. (high-dose).  We will place her on Sinequan at this time at  bedtime as well.  Pending results are her imaging studies.  We will have  Neurology and Physical Therapy evaluate the patient as well.  We will  replete electrolytes and encourage good nutrition.  Follow up pending  results of the above.           ______________________________  Lind Guest. August Saucer, M.D.     ELD/MEDQ  D:  01/12/2009  T:  01/12/2009  Job:  [id number]

## 2010-11-22 NOTE — Consult Note (Signed)
[name], [name]          ACCOUNT NO.:  [id number]   MEDICAL RECORD NO.:  [id number]          PATIENT TYPE:  INP   LOCATION:  3019                         FACILITY:  [facility]   PHYSICIAN:  Hewitt Shorts, M.D.DATE OF BIRTH:  Oct 12, 1970   DATE OF CONSULTATION:  01/14/2009  DATE OF DISCHARGE:                                 CONSULTATION   HISTORY OF PRESENT ILLNESS:  The patient is a 40 year old female who is  seen in the neurosurgical consultation on request of Dr. Willey Blade  regarding cervical spondylosis.   The patient was admitted on January 11, 2009, because of difficulties with  refractory urticaria and complaints of discomfort in her neck worse on  the left than the right side.  It can extend up into the occiput and  extend down around the scapula and down to the shoulders, arms, and  forearms bilaterally with some numbness and tingling in the distal upper  extremities.   The patient was admitted by Dr. Willey Blade and workup initiated including  MRI scan of the cervical spine, subsequently cervical spine x-rays were  done.  The patient has been seen in Neurology consultation as well as in  Rheumatology consultation by Dr. Zenovia Jordan and is also been seen in  Psychiatry consultation by Dr. Geralyn Flash.   The patient reports a long history of chronic pain and has been followed  and treated by a neurologist as well as other physicians in [facility], where she has lived.  She has been diagnosed by them with  fibromyalgia and has been treated with numerous medications, many of  which she has had a variety of side effects or reactions too.   The patient is originally from [place], [place].  Her parents  continue to live in this area and she returned home to be evaluated by  Dr. August Saucer (who is both of her parents' primary physician).   In addition to having being diagnosed with fibromyalgia, she has had a  history of chronic urticaria.  As part of her  treatment of chronic pain  and fibromyalgia, she has been treated with extensive amounts of variety  of narcotic medications.   PAST MEDICAL HISTORY:  Notable for chronic urticaria, fibromyalgia,  chronic pain, and psoriasis.   PAST SURGICAL HISTORY:  Previous surgeries include cholecystectomy,  hysterectomy, tonsillectomy and adenoidectomy, and breast implants.   ALLERGIES:  She reports side effects to numerous medications, documented  in the chart including SULFA, CODEINE, and HYDROCODONE, but also  intolerances to LYRICA, CYMBALTA, LEXAPRO, and ZOLOFT.   MEDICATIONS:  Extensive narcotic medications for her chronic pain as  well as Prevacid, Vistaril, Xanax, and Advair.   FAMILY HISTORY:  Parents, both are alive and well, but her mother does  have fibromyalgia and is followed by Dr. Pollyann Savoy as well as by  Dr. Willey Blade here.   SOCIAL HISTORY:  The patient is separated from her husband.  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  She describes 3 children who are healthy.  She does not smoke, drink  alcohol beverages,  and denies history of substance abuse.   REVIEW OF SYSTEMS:  Notable for those described in the history of  present illness and past medical history but is otherwise unremarkable.   PHYSICAL EXAMINATION:  GENERAL:  The patient is well-developed, well-  nourished white female in no acute distress.  VITAL SIGNS:  Temperature is 98.7, pulse 66, blood pressure 103/69.  LUNGS:  Clear to auscultation.  She has symmetrical respiratory  excursion.  HEART:  Regular rate and rhythm.  Normal S1 and S2.  There is no murmur.  EXTREMITIES:  No clubbing, cyanosis, or edema.  MUSCULOSKELETAL:  Good range of motion of neck and extremities.  NEUROLOGIC:  Shows 5/5 strength in the upper and lower extremities  including deltoid, biceps, triceps, intrinsics, grip, iliopsoas,  quadriceps, dorsiflexor, extensor hallucis longus, and plantar  flexor  bilaterally.  Sensation is intact to pinprick.  Reflexes are minimal in  the biceps, brachioradialis, triceps, quadriceps, and gastrocnemius are  symmetrical bilaterally.  Toes are downgoing bilaterally.   DIAGNOSTIC STUDIES:  MRI of the cervical spine done January 12, 2009, at  [facility] was reviewed, there is a small spur to the right at  C4-5, it does not cause any spinal cord or thecal sac compression.  Other than for that the cervical spine MRI is unremarkable.  This spur  was reconfirmed on oblique x-rays also done on January 12, 2009, at [facility].   IMPRESSION:  The patient with rather limited cervical spondylosis with a  small spur to the right at C4-5 not causing significant neuro  compression and causing no spinal cord or thecal sac compression with  little if any other degenerative changes seen in the cervical spine.   The patient has diffuse complaints of pain in the neck worse on the left  than the right.  Discomfort extending up into the occiput bilaterally  and down along the scapula and down to the upper extremities  bilaterally, again left worse than the right.  This is unrelated to the  minimal cervical spondylitic changes described above.   The patient is neurologically intact with good strength and sensation.  No evidence of myelopathy and radiologically, she has no significant  neuro compression.   RECOMMENDATIONS:  There is no indication for neurosurgical intervention.  I do not favor surgical intervention for this minimal cervical  spondylitic spur.  I do not believe that it is causing any of her  symptoms and I do not feel that she would benefit from any type of  neurosurgical intervention.  Further there is no indication for ongoing  neurosurgical followup and instead, I feel that she should continue to  work with Dr. Willey Blade, her local primary physician as well as to  follow up with Dr. Zenovia Jordan, the rheumatologist who saw her  here in  [facility] yesterday, as well as with her treating neurologists  in [place], [place].   I spoke with the patient at length.  Discussed her MRI and x-rays at  length with her and explained my recommendations and assessment to her,  and her questions were thoroughly answered.      Hewitt Shorts, M.D.  Electronically Signed     RWN/MEDQ  D:  01/14/2009  T:  01/15/2009  Job:  [id number]   cc:   Minerva Areola L. August Saucer, M.D.

## 2010-11-22 NOTE — Consult Note (Signed)
[name], [name]          ACCOUNT NO.:  [id number]   MEDICAL RECORD NO.:  [id number]          PATIENT TYPE:  INP   LOCATION:  3019                         FACILITY:  [facility]   PHYSICIAN:  Anselm Jungling, MD  DATE OF BIRTH:  03/07/1971   DATE OF CONSULTATION:  01/14/2009  DATE OF DISCHARGE:                                 CONSULTATION   IDENTIFYING DATA AND REASON FOR REFERRAL:  The patient is a 40 year old  married Caucasian female, who was usually works as a Geologist, engineering.  She is from the [place] area, but is currently staying with her  parents here in [place] and may move back here.  She is a patient of  Dr. August Saucer,  her family physician.  She is admitted to [facility]  due to severe and intractable urticaria and associated problems.  Psychiatric consultation is requested for general evaluation and  recommendations.   HISTORY AND PRESENTING PROBLEMS:  The following information is mostly  provided by the patient, who appears to be a very good historian.  She  has had a long history of chronic urticaria, hives, and a history of  poor response to treatment for these.  At present, she is being treated  with of a variety of antihistaminic medications and prednisone course.  She has had evaluations by rheumatology and carries associated diagnosis  of fibromyalgia.  She has also recently had gynecologic surgery,  including partial hysterectomy, with associated anemia and weight loss.   The patient states that she recently moved up to her parents here in  [place] because she was becoming severely depressed at her home in  [place].  She has recently learned that her husband had been having  an affair.  Also, due to her medical absences, she lost her job.   She has been treated with antidepressant medications in the past  including Lexapro and Zoloft, but states that she is allergic to them  and develops hives in response.  She has used Seroquel in the  past to  help her sleep, and she states that it has been very helpful.   At present, she is ordered Seroquel 50 mg q.h.s..  She is not on any  other psychotropics, except for some benzodiazepines for anxiety.   The patient is very open to any kind of help that I might offer at this  point.  She denies any history of psychiatric hospitalizations, and  aside from periods of depression, has not had any major psychiatric  disturbance in her lifetime.   MENTAL STATUS AND OBSERVATIONS:  The patient is a slender, but  adequately nourished, adult female who I interviewed in her hospital  room.  She is extremely pleasant, engageable, with bright affect, and a  sweet disposition in nature.  She appears to be a good historian.  She  is clearly of above average intelligence and education.  She is fairly  psychologically sophisticated.  She is a good historian.  Her thoughts  and speech are normally organized.  There is nothing to suggest any  significant underlying psychiatric disturbance.  She indicates that  although she  has been depressed lately, she has never had any thoughts  or impulses towards suicide.  She describes her family is very  supportive.  She has three children, ages 31, 29 and 42.  Apparently they  are all up here in [place] with her parents.   IMPRESSION:  The patient has an unusual history of symptoms, involving  recalcitrant urticaria, fibromyalgia, and depression, much of which may  be situational, in response to her current marital crisis, job loss, and  medical problems.  She has not been able to tolerate trials of an SSRI  antidepressants in the past.  She has benefited from Seroquel.   At this time, one might also entertain the diagnosis of neurodermatitis.  Although a delightful and bright individual who was a pleasure to talk  to, she does appear to be somewhat high-strung in nature, and there may  be partial psychogenic component to her difficulties.   DIAGNOSTIC  IMPRESSION:  Axis I:  Mood disorder not otherwise specified,  secondary to medical condition. Rule out neurodermatitis not otherwise  specified.  Axis II:  Deferred.  Axis III:  See medical notes.  Axis IV:  Stressors severe.  Axis V:  Global assessment of function  85.   RECOMMENDATIONS:  I discussed the possibility of some medication trials  with the patient.  Of first of all, she may benefit from higher doses of  Seroquel than she has been on in the past.  I am recommending an  increased to 100 mg.  Also, she might benefit from a trial of a  tricyclic antidepressant.  I am recommending initiation Elavil 25 mg  q.h.s.Marland Kitchen  I will discuss this with Dr. August Saucer on the telephone later today.  I discussed potential risks and benefits and side effects of a trial of  Elavil.   The patient agrees that she needs referral to a local psychiatrist for  ongoing follow-up of her medication treatment for depression, and/or  anxiety, as she may continue on some benzodiazepines as well.   I spoke with her about the possibility of coming to our intensive  outpatient psychiatric program for 2-3 weeks following her medical  stabilization, but this would depend on insurance preauthorization,  which may be questionable, since she is covered by Medicaid.   Nonetheless, I would be glad to follow her during her inpatient stay  here.  Dr. Antonietta Breach will return from his vacation and will be  available starting July 12 for ongoing psychiatric consultation.   Thank you for involving me in this patient's care.      Anselm Jungling, MD  Electronically Signed     SPB/MEDQ  D:  01/14/2009  T:  01/14/2009  Job:  [id number]

## 2010-11-22 NOTE — Consult Note (Signed)
[name], [name]          ACCOUNT NO.:  [id number]   MEDICAL RECORD NO.:  [id number]          PATIENT TYPE:  OBV   LOCATION:                               FACILITY:  [facility]   PHYSICIAN:  Michael L. Reynolds, M.D.DATE OF BIRTH:  01/29/2011   DATE OF CONSULTATION:  01/26/2009  DATE OF DISCHARGE:                                 CONSULTATION   CHIEF COMPLAINT:  Urinary retention, low back pain.   HISTORY OF PRESENT ILLNESS:  This is a patient who was previously  consulted by Dr. Thad Ranger back on January 12, 2009.  At that time the  consultation was for cervical pain and left arm numbness.  At that time  an MRI was obtained which showed a C4-5 large right foraminal disk  extrusion versus osteophyte complex.  On January 12, 2009 Neurology was  consulted.  At that time it was found that she was not a candidate for  surgery and they recommended physical therapy and stretching exercises.  Today's date is January 26, 2009.  At this time we were reconsulted for  this patient as over the last 2 days the patient has been complaining of  some urinary retention.  It was first noted on 01/23/2009 where nursing  notes that the patient was complaining of distended bladder and had only  voided one time throughout the day.  An in-and-out cath was placed, 320  mL of light yellow urine were expelled at that time.  On 01/25/2009 the  patient continued to complain of urinary retention.  The patient was  offered an in-and-out cath at that time, however the patient refused and  wanted a continuous Foley.  On January 26, 2009 after installation of the  Foley, 400 mL of urine was obtained upon insertion.  Due to question of  urinary retention an MRI of the patient's L-spine was obtained which  showed L4-5 to have a minimal disk bulge.  There was mild facet and  ligamentous prominence.  There was narrowing of the lateral recess  without gross neural compression.  At level L5-S1 disk is degenerated.  A shallow  broad-based herniation of disk material, however in the facet,  ligaments are hypertrophic.  There is stenosis of both lateral recesses.  S1 nerve root could be affected, however, the radiologist read out that  he did not see any neural compressive lesion that one would expect  resulting in urinary retention.   After speaking with the patient and the patient's mother, information  was obtained that the patient has had a urethral dilatation in the past  for previous episodes.   At present time the patient is comfortable, is able to answer all  questions, is alert and oriented,  She has an indwelling catheter in  place.  She has no complaints of bladder distention or back pain at this  time.   PAST MEDICAL HISTORY:  1. Depression.  2. Cholecystectomy in 2006 for biliary dyskinesia.  3. Status post laparoscopic abdominal hysterectomy.  The patient's      ovaries are still intact.  4. She has saline implants x2, last being in 1997.  5.  She has chronic urticaria.  6. Asthma.  7. Fibromyalgia.  8. Depression.  9. GERD.  10.Mood disorder.   MEDICATIONS:  The patient at this time is on:  1. Xanax.  2. Augmentin.  3. Soma.  4. Colchicine.  5. Colace.  6. Pepcid.  7. Diflucan.  8. Advair.  9. Claritin.  10.Morphine sulfate.  11.MS Contin 15 mg p.o. b.i.d.  12.Multivitamins.  13.Triple antibiotic applied topically b.i.d.  14.Protonix.  15.Pyridium.  16.MiraLax.  17.K-Dur.  18.Seroquel 200 mg nightly p.r.n.  19.P.r.n. medications include Xanax, Atarax, Ondansetron, Pyridium,      Phenergan, Senokot, Restoril and Ultram.   ALLERGIES:  INCLUDE SULFA, SERTRALINE, DIPHENHYDRAMINE, CONTRAST MEDIA,  CODEINE, PERCOCET, ROXICET, LYRICA.   SOCIAL HISTORY:  The patient does not smoke, drink or do illicit drugs.  She has three children.  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   REVIEW OF SYSTEMS:  Negative with the exception of  above.   PHYSICAL EXAM:  VITAL SIGNS:  Stable.  Temperature is 97.3, pulse is 89,  respirations 18, blood pressure is 117/72, O2 saturations 99.  MENTAL STATUS:  She is alert.  She is oriented x3.  She carries out two  and three-step commands without any difficulty.  She is well-enunciated,  well-articulated and has clear mentation.  PULMONARY:  Clear to auscultation bilaterally.  CARDIOVASCULAR:  S1, S2.  Regular rate and rhythm.  No murmurs, rubs or  gallops.  NECK:  Negative for bruits and supple.  ABDOMEN:  Soft, nontender, nondistended.  Bowel sounds in all quadrants.  NEUROLOGICAL:  Cranial nerves:  Pupils are equal, round, reactive,  accommodating to light.  She has conjugate gaze.  Extraocular muscles  are grossly intact.  Visual fields are grossly intact.  Face is  symmetrical.  Tongue is midline at this time, uvula is midline.  Her  sensation bilaterally of the face of the trigeminal nerve V1-V3 is full  to pinprick, light touch and vibration.  Shoulder shrug, head turn full  range of motion.  The patient is not complaining of any neck pain or  back pain at this time.  She is able to move all her extremities with  5/5 strength in full range of motion.  Sensation throughout her trunk  and extremities are grossly intact to pinprick, light touch, vibration,  proprioception.  The patient's finger-to-nose bilaterally and heel-to-  shin bilaterally showed no dysmetria and are smooth.  Fine motor  movements, rapid alternating movements are within normal limits.  The  patient's deep tendon reflexes are 2+ throughout.  The patient's toes  are bilaterally downgoing.  The patient shows no muscle wasting and no  decreased tone throughout.   LABORATORY DATA:  White blood cell count is 11.7, platelet count 295,  hemoglobin/hematocrit 12.0 and 34.8.  Sodium is 129, potassium 4.2,  chloride 92, CO2 is 30, glucose 96, BUN is 6, creatinine is 0.65.  AST  is 16, ALT is 19, total protein is  5.8, amylase is 151, lipase 77.  UA  is negative.  Urine culture is negative.   IMPRESSION:  1. At this time urinary retention probably secondary to      anticholinergic medications; Elavil, Remeron, Seroquel and      morphine; plus or minus anxiety disorder.  2. Low back pain with minimal disk disease.  No evidence of      compressive pathology on MRI.   RECOMMENDATIONS:  1. Would reduce medications  as much as possible.  2. Would check a postvoiding residual.  3. Would recommend physical therapy for range of motion and stretching      as an outpatient for her low back pain.  4. If urinary retention persists, would consider a Urology evaluation      as the patient has had urethral dilatation in the past.   Thank you very much for this consultation.  We will follow up with this  patient on an as needed basis.     ______________________________  Felicie Morn, PA-C      Marolyn Hammock. Thad Ranger, M.D.  Electronically Signed    DS/MEDQ  D:  01/26/2009  T:  01/26/2009  Job:  [id number]   cc:   Minerva Areola L. August Saucer, M.D.  Michael L. Thad Ranger, M.D.

## 2010-11-22 NOTE — Consult Note (Signed)
[name], Barron          ACCOUNT NO.:  [id number]   MEDICAL RECORD NO.:  [id number]          PATIENT TYPE:  OBV   LOCATION:  3019                         FACILITY:  [facility]   PHYSICIAN:  Antonietta Breach, M.D.  DATE OF BIRTH:  May 03, 1971   DATE OF CONSULTATION:  01/22/2009  DATE OF DISCHARGE:                                 CONSULTATION   REQUESTING PHYSICIAN:  Eric L. August Saucer, M.D.   REASON FOR CONSULTATION:  Mood swings.   HISTORY OF PRESENT ILLNESS:  [name] is a 40 year old  female admitted to the [facility] on January 11, 2009 for refractory  urticaria.  [name] was experiencing approximately 10 days of  depressed mood, low energy, difficulty concentrating, and anhedonia  prior to admission.  Since admission, she has been experiencing some  ideas of reference regarding people listening to her from the hallway.  She also has euphoria, racing thoughts, and expansive mood.  Her energy  is increased.  She is receiving Xanax 1 mg t.i.d. with a 2100 dose.  She  also is requiring Restoril 30 mg q.h.s. and has already been started on  Seroquel 100 mg q.h.s.  She continues with the above symptoms.  She has  had a number of stresses over the past year.  Please see below.   PAST PSYCHIATRIC HISTORY:  1. She never has been seen by psychiatrist.  However, she has been      prescribed psychotropic medication by primary care physicians for      her mood episodes.  2. She describes several years of periods where she will go into high-      energy euphoria, racing thoughts, lasting at least 4 days where she      has decreased need for sleep.  She will engage in projects of high      activity and will have grandiosity.  3. She can feel the depressed mood phase coming towards the end of her      manic periods, then the manic periods are followed by a 1-2 week      period of depression.  4. She has been tried on Lamictal in the past.  She had side effects      with  that.  She also has been tried on a number of antidepressants      that resulted in urticaria.  These have included Zoloft and      Lexapro.  She also has had the same reaction with Cymbalta and      Lyrica.  5. She has no history of suicide attempts.  She has never had suicidal      thoughts.  6. She also has a long-term history of cleaning and safety checking      rituals that consume at least an hour of her day and are      nonproductive.  She has feeling-on-edge and excessive worry.  7. To repeat her history of psychotropic adverse reaction:  Lamictal,      Celexa, Cymbalta, Lexapro, and Zoloft.  8. She has never been in a psychiatric hospital.   FAMILY PSYCHIATRIC HISTORY:  None  known.   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  However, she has family in [place] and is originally  from [place].  She has three children.  In the fall of this year, she  discovered that her common-law husband had been cheating on her.  She  had born 2 children by him.  She also had a hysterectomy at that time  due to abnormal bleeding.  She denies alcohol or illegal drugs.  She  went to [employer] and received her R.N. in 1992.   PAST MEDICAL HISTORY:  1. Refractory urticaria.  2. Fibromyalgia.  3. Gastroesophageal reflux disease.  4. Status post cholecystectomy.  5. History of abdominal hysterectomy.   ALLERGIES:  SULFA, SERTRALINE, DIPHENHYDRAMINE, CONTRAST MEDIA, CODEINE,  OXYCODONE, APAP, LYRICA.   MEDICATIONS:  MAR is reviewed.  She is on Seroquel 100 mg q.h.s.,  Restoril 30 mg q.h.s., Xanax 1 mg t.i.d.   LABORATORY DATA:  Sodium 129, BUN 7.0, creatinine 0.7.   REVIEW OF SYSTEMS:  PSYCHIATRIC:  She did have some brief bulimic  symptoms in high school involving self-vomiting.  They have stopped.  She never has lost weight and lost a period secondary to that process.  She denies any history of  depriving herself of food in order  to lose  weight.  She has had some difficulty with appetite over the past 3  weeks.  Constitutional, head, eyes, ears, nose, throat, mouth, neurologic,  cardiovascular, respiratory, gastrointestinal, genitourinary, skin,  musculoskeletal, hematologic, lymphatic, endocrine, metabolic all  unremarkable.   EXAMINATION:  VITAL SIGNS:  Temperature 98.2, pulse 84, respiratory rate  16, blood pressure 120/82, O2 saturation room air 100%.  GENERAL APPEARANCE:  [name] is a middle-aged female sitting up in  her hospital bed with no abnormal involuntary movements.   MENTAL STATUS EXAM:  [name] does have intact eye contact, except  she occasionally will look to the right in the direction of the door  clearly having ideas of reference regarding people listening.  Her  attention span is slightly decreased.  Her affect is euphoric.  Her mood  is euphoric and occasionally anxious.  Concentration is mildly  decreased.  She is oriented to all spheres.  Her memory is intact to  immediate, recent, and remote.  She does have occasional inappropriate  laughing and she is oriented to all spheres.  Her fund of knowledge and  intelligence are within normal limits.  Her speech is pressured at  times.  There is no dysarthria.  Thought process involves some  tangentiality and circumstantiality as well as increased speed.  Thought  content:  No thoughts of harming herself or others.  No delusions or  hallucinations.  Her insight is intact.  Her judgment is impaired for  outside hospital functioning.   ASSESSMENT:  Axis I:  293.84.  Anxiety disorder not otherwise specified.  She does have history of obsessive-compulsive disorder.  She also has  some generalized anxiety symptoms.  296.80 bipolar disorder not  otherwise specified.  Axis II:  Deferred.  Axis III:  See past medical history.  Axis IV:  General medical primary support group, marital.  Axis V:  45.   [name] is not at risk to harm  herself or others.  She agrees to  call emergency services for any psychiatric emergency symptoms.   She does understand that she could be at risk for self-neglect if  outside the hospital, given the  degree of her mood symptoms.  She has  this level of insight.   The undersigned provided ego supportive psychotherapy and education.   The indications, alternatives, and adverse effects of lithium, Lamictal,  Tegretol, and valproic acid were discussed along with the atypical  antipsychotics regarding mood stabilizers.  She declined lithium, she  declined being exposed to Depakote or Tegretol given that she was  already tolerating Seroquel.   She understands that Seroquel does have a risk of a nonreversible  movement as well as hyperglycemia and metabolic syndrome.  She wants to  proceed with Seroquel as her primary mood stabilizer.   The indications, alternatives and adverse effects of benzodiazepines  were reviewed as anti acute anxiety agents and potentially augmenting  for mood stabilization.  She understands and wants to proceed with  benzodiazepine therapy.   The prospect of utilizing cognitive behavioral therapy later was  discussed as an outpatient course of psychotherapy.   RECOMMENDATIONS:  1. Would increase the Seroquel by 50 mg per day as tolerated to the      initial trial dose of 300 mg q.h.s.  2. However, would check an EKG tomorrow given that her QTC on the EKG      was 481, and would monitor the EKG.  If her QTC exceeds 500      milliseconds, would stop the Seroquel.  Other mood stabilizers      would need to be considered, potentially non antipsychotics with no      QTC risk.  3. Would monitor for stiffness or other extrapyramidal side effects.  4. Would discontinue the 2100 Xanax dosage and continue with Xanax 1      mg b.i.d. for anti anxiety.  5. Would change her Restoril to 30 mg q.h.s. p.r.n.  6. Low stimulation ego support.  7. She declines moving to an  inpatient psychiatric ward at this time      and she is still on a general medical ward regarding her general      medical problems.      Antonietta Breach, M.D.  Electronically Signed     JW/MEDQ  D:  01/25/2009  T:  01/25/2009  Job:  [id number]

## 2010-11-22 NOTE — Consult Note (Signed)
NAMEKENIDY, CROSSLAND          ACCOUNT NO.:  [id number]   MEDICAL RECORD NO.:  [id number]          PATIENT TYPE:  OBV   LOCATION:  3019                         FACILITY:  [facility]   PHYSICIAN:  Jordan Hawks. Elnoria Howard, MD    DATE OF BIRTH:  10-Jun-1971   DATE OF CONSULTATION:  01/21/2009  DATE OF DISCHARGE:                                 CONSULTATION   REFERRING PHYSICIAN:  Eric L. August Saucer, MD   REASON FOR CONSULTATION:  Nausea and vomiting and abdominal pain.   HISTORY OF PRESENT ILLNESS:  This is a 40 year old female with a past  medical history of fibromyalgia, urticarial vasculitis, history of  gastroesophageal reflux disease, depression, mood disorder, status post  cholecystectomy for biliary dyskinesia, and status post abdominal  hysterectomy, presented to the hospital initially for urticaria.  The  patient was examined, has a complex medical course in regards to her  urticarial vasculitis as well as complaints of bilateral upper extremity  weakness.  She was evaluated by Rheumatology and Neurosurgery as well as  Psychiatry in regards to her symptoms.  At this time, it appears that  these issues are currently being addressed, however, several days before  this consultation, the patient started complaining of having nausea and  vomiting and also abdominal pain.  The patient states that she has had a  history of nausea and vomiting in the past and she was treated with  Prevacid SoluTabs, which resolved her symptoms.  However, with further  questioning, she apparently underwent a laparoscopic cholecystectomy for  biliary dyskinesia and at that time, her symptoms have not abated with  the surgery and subsequently she was placed on this SoluTab.  Per her  report, she has been on this medication continuously and has not had any  further issues in regards to gastroesophageal reflux disease until this  time unfortunate.  She states that she is not able to eat without having  any significant  symptoms of nausea and subsequently the vomiting.  In  the past, she has undergone an EGD, and it was apparently negative, and  this was performed in [place], [place], associated with her  complaints is epigastric abdominal pain, which is new since this  admission.  Past medical history in the past when she had her  cholecystectomy, her symptoms were not similar to what she is currently  experiencing at this time.   PAST MEDICAL AND PAST SURGICAL HISTORY:  As stated above.   FAMILY HISTORY:  Noncontributory.   SOCIAL HISTORY:  The patient works as a Building control surveyor.  No alcohol,  tobacco, or illicit drug use.  She has 3 children.   ALLERGIES:  SULFA, SERTRALINE, DIPHENHYDRAMINE, CONTRAST MEDIA, CODEINE,  CYMBALTA, LYRICA, and also PERCOCET.   REVIEW OF SYSTEMS:  As stated above in the history present illness,  otherwise negative.   MEDICATIONS:  1. Alprazolam 1 mg p.o. p.r.n.  2. Amitriptyline 25 mg p.o. nightly.  3. Colchicine 0.6 mg p.o. b.i.d.  4. Colace 200 mg p.o. daily.  5. Pepcid 20 mg p.o. b.i.d.  6. Fluconazole 150 mg p.o. daily x7 days.  7. Claritin  10 mg p.o. b.i.d.  8. Morphine sulfate 15 mg p.o. b.i.d.  9. MiraLax 17 g p.o. daily.  10.Prednisone 20 mg p.o. daily.  11.Seroquel 100 mg p.o. nightly.  12.Zanaflex 2 mg p.o. b.i.d.  13.Tramadol 50 mg p.o. q.i.d.  14.Xopenex 2 puffs inhaler q.i.d. p.r.n.  15.Zofran 4 mg IV q.4 h. p.r.n.  16.Phenergan 12.5 mg p.o. q.4 h. p.r.n.   PHYSICAL EXAMINATION:  VITAL SIGNS:  Blood pressure is 122/80, heart  rate is 80, respirations 16, temperature is 98.2.  GENERAL:  The patient is in no acute distress, alert, appears to be  somewhat somnolent, but oriented.  HEENT:  Normocephalic and atraumatic.  Extraocular muscles intact.  NECK:  Supple.  No lymphadenopathy.  LUNGS:  Clear to auscultation bilaterally.  CARDIOVASCULAR:  Regular rate and rhythm.  ABDOMEN:  Flat, soft, tender in the epigastrium.  No rebound  or  rigidity.  Positive bowel sounds.  EXTREMITIES:  No clubbing, cyanosis, or edema.   LABORATORY VALUES:  Sodium 129, potassium 4.2, chloride 92, CO2 of 29,  glucose 108, BUN 7, and creatinine 0.7.  Total bilirubin 0.5, alk phos  72, AST 12, ALT 20, albumin 4.1, and amylase 170.   IMPRESSION:  1. Nausea and vomiting.  2. Epigastric pain.  3. History of gastroesophageal reflux disease.  4. Urticarial vasculitis.  5. Fibromyalgia.   The patient has a very complex history in regards to her GI complaints,  I am unable to identify the exact cause of her etiology.  She certainly  has a history of gastroesophageal reflux disease and is well controlled  with Prevacid SoluTabs.  I am uncertain if she is having exacerbation of  reflux or a manifestation of esophagitis.  She is on multiple  medications and this can also be an issue.  Her amylase is also noted to  be mildly elevated and there is some epigastric pain, but I do not get  the sense that this is truly representative of a pancreatitis at this  time.   PLAN:  1. EGD will be performed for further evaluation.  2. Continue with Protonix.  3. Check lipase.  Pending the results, further imaging may be      performed.      Jordan Hawks Elnoria Howard, MD  Electronically Signed    PDH/MEDQ  D:  01/21/2009  T:  01/22/2009  Job:  [id number]

## 2010-11-25 NOTE — Consult Note (Signed)
NAMECARMISHA, LARUSSO          ACCOUNT NO.:  [id number]   MEDICAL RECORD NO.:  [id number]          PATIENT TYPE:  OBV   LOCATION:  3019                         FACILITY:  [facility]   PHYSICIAN:  Antonietta Breach, M.D.  DATE OF BIRTH:  [birth year]   DATE OF CONSULTATION:  DATE OF DISCHARGE:  01/29/2009                                 CONSULTATION   Ms. Marszalek developed some lethargy yesterday and her Seroquel was  reduced to 100 mg nightly; however, she still required 30 mg of  Restoril.   She also has continued to require Xanax 1 mg t.i.d. for feeling on edge.   Her thought racing has stopped.  She is able to talk without pressured  speech.  She has constructive future goals and interests.  She does not  have any hallucinations or delusions.   Her mood is within normal limits.  Her orientation and memory function  are normal.   CARDIOVASCULAR:  EKG, QTC 456 milliseconds.   REVIEW OF SYSTEMS:  NEUROLOGIC:  No stiffness or other extrapyramidal  side effects with her Seroquel.   PHYSICAL EXAMINATION:  MENTAL STATUS:  Ms. Bradway is alert.  Her eye  contact is good.  Her attention span is normal.  Her affect is slightly  flat at baseline, but with a broad and appropriate range.  Mood is  within normal limits.  Concentration normal.  She is oriented to all  spheres.  Her memory function is intact to immediate, recent, and  remote.  Her fund of knowledge and intelligence are within normal  limits.   Her speech involves normal rate and prosody without dysarthria, thought  process, logical, coherent, goal directed.  No looseness of  associations.  Thought content, no thoughts of harming herself or  others.  No delusions or hallucinations.   Her insight is intact.  Her judgment is intact.   ASSESSMENT:  AXIS I:  293.84 anxiety disorder, not otherwise specified.  AXIS II:  296.80 bipolar disorder, not otherwise specified.   Ms. Sturgeon is cleared for outpatient psychiatric  care.   She is not at risk to harm herself or others.  She agrees to call  emergency services immediately for any thoughts of harming herself,  thoughts of harming others or distress.   RECOMMENDATIONS:  Would start intensive outpatient psychiatric care, is  available during the first week of discharge.  If not available, would  ask the social worker to set Ms. Kaine up with psychiatric medication  appointment during the first week of discharge.   If is still requiring Restoril at night, we would increase her Seroquel  back to 150 mg nightly.   She is a candidate for cognitive behavioral therapy combined with deep  breathing and progressive muscle relaxation, which will eventually  reduce her need for Xanax and potentially eliminate the need for Xanax.      Antonietta Breach, M.D.  Electronically Signed     JW/MEDQ  D:  01/31/2009  T:  02/01/2009  Job:  [id number]

## 2010-11-25 NOTE — Discharge Summary (Signed)
NAMEIVANIA, [name]          ACCOUNT NO.:  [id number]   MEDICAL RECORD NO.:  [id number]          PATIENT TYPE:  OBV   LOCATION:  3019                         FACILITY:  [facility]   PHYSICIAN:  Eric L. August Saucer, M.D.     DATE OF BIRTH:  1970-07-14   DATE OF ADMISSION:  01/11/2009  DATE OF DISCHARGE:  01/29/2009                               DISCHARGE SUMMARY   FINAL DIAGNOSES:  1. 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  2. Fibromyalgia, severe.  3. Urinary retention, improved.  4. Cervical disk disease, nonsurgical.  5. Chronic neck pain secondary to cervical disk disease and      fibromyalgia.  6. Bipolar disorder with recent exacerbation.  7. Herniated disk L5-S1 with chronic low back pain.  8. Colonic dysfunction, improving.  9. Atypical pancreatitis, resolving.  10.Hyponatremia questionably secondary to adverse medication effects,      improving.  11.Failure to thrive.  12.Gastroesophageal reflux disease.   OPERATIONS AND PROCEDURES:  1. MRI scan of the brain and cervical spine.  2. MRI scan of lumbosacral spine.  3. Nuclear medicine gastric emptying study.  4. Ultrasound of the abdomen.   HISTORY OF PRESENT ILLNESS:  This was a second [facility]  admissions for this 40 year old white female R.N. from [place],  [place] who presented complaining of increasing weakness and  recurrent urticaria.  The patient has history of chronic urticaria for  greater than 5 years.  She had been treated by an allergist in [facility], Dr. Rush Barer.  The patient had had skin biopsies, which confirmed a  chronic urticaria with associated urticarial vasculitis.  There was a  question of possible cold-induced urticaria as well.  The patient had  been on a number of agents with moderate success.  The patient on June  13 developed an upper respiratory infection with a temperature spike to  103.  This was followed by exacerbation of hives with associated  difficulty  breathing and associated asthmatic bronchitis.  The patient  did undergo antibiotic therapy.  She also used her inhalers with gradual  improvement of her breathing.  The patient however has continued to have  recurrent urticaria on a daily basis.  The patient had taken several  medications which have caused excessive dryness as well.  The patient  notably had episodes that exacerbated at night.  They occur mostly on  arms and legs.  She had also occasionally had exacerbation of asthma  during these attacks as well for which she carries EpiPen.  The patient  had increasing problems with upper extremity weakness to the point of  dropping objects when carrying pots and pans.  She had noted tingling in  hands as well as forearms.  She had been diagnosed in the past as having  fibromyalgia.  The patient has history of bulging disks of the cervical  spine for which she had undergone treatment including Lyrica and  Cymbalta.  These agents had exacerbated her hives most recently as well.  The patient has also been on Lexapro and Zoloft with similar results.  The patient currently had been unable to work due  to increasing neck  pain and dysesthesias in her arms.  She most recently had been on MSSR  per neurologist.  It had been attempted to wean her off this medication  to once a day.  She however continues to have significant pain.   The patient has also had problems with progressive dysphagia.  She had  had progressive weight loss.  She acknowledges ongoing multiple  stressors.  With the progression of the above-noted symptoms, the  patient was subsequently admitted for further evaluation and therapy.   Past medical history and physical exam is per admission H&P.   HOSPITAL COURSE:  The patient was admitted for further evaluation of her  upper extremity weakness, refractory urticaria, fibromyalgia, and weight  loss.  She was placed on IV fluids with D5 1/4 normal saline at 50 mL  now for  hydration.  She underwent a series of studies including C-spine,  chest x-ray, abdominal films.  She also underwent extensive serological  markers as well.  The patient was subsequently seen in consultation by  Rheumatology as well.  An MRI scan of the head was obtained to rule out  multiple sclerosis, which was negative.  MRI scan of C-spine was  obtained as well.  She was noted to have a large right foraminal disk  extrusion versus disk osteophyte at C4-5 on the right.  This was  associated with severe right C5 foraminal stenosis as read by Radiology.  She also had evidence for facet arthritic changes as well.  The patient  underwent initial medication adjustments.  She was seen by Dr. Thad Ranger  of Neurology.  She did undergo further medication adjustments.  This  however did not promote significant improvement of her symptoms.  It was  felt that her clinical picture did not match her MRI scan picture.  After several trials of medication intervention, however, the patient  was seen by Dr. Newell Coral of Neurosurgery.  It was felt that her symptoms  would not be improved by any surgical intervention.  Further medication  and physical therapy was recommended.   The patient underwent several interventions in efforts to control her  severe urticaria, which was clinically most consistent with an  urticarial vasculitis.  It was associated with marked dysesthesias and  pain as well.  She was placed on a combination of antihistamine agents  with Xyzal b.i.d. as well as colchicine and prednisone therapy.  Serological markers for cold infections, i.e. [facility] spotted  fever, H. pylori, hepatitis, and lupus were negative.  She was seen in  consultation by Dr. Zenovia Jordan of Rheumatology.  Several  recommendations were made for maximizing her therapy.  It was noted that  the patient had a combination of diffuse fibromyalgia as well as  urticarial vasculitis.  It was suggested this would make  management  difficult with medication combination.  She was treated with prednisone  high dose with gradual taper.  She was maintained on antihistamine  therapy as noted above.  Gradually with this intervention the patient's  urticaria did subside.   Because it was clear the patient had significant stressors aggravating  her above-noted condition, she was seen in consultation notably by Dr.  Electa Sniff initially and then Dr. Jeanie Sewer thereafter.  Upon evaluations,  it was confirmed that the patient did indeed have bipolar disorder,  which had not been treated previously.  She was subsequently started on  Seroquel, which was gradually increased to a dose of 200 mg at bedtime.  On this  dose, the patient became extremely drowsy and nonfunctional.  She notably had some increased problems with urinary retention during  that time as well.  She was seen by Urology, which raised a question of  possible neurogenic bladder.  Her Seroquel dosing however was gradually  tapered down.  The patient was also placed on Flomax per Urology  recommendation with requirements for intermittent in-and-out  catheterization as well.  With this over time her symptoms did improve.   During her hospital stay, she was also noted to have significant  abdominal pain.  Abdominal ultrasound was unremarkable.  She was seen in  consultation by Dr. Jeani Hawking of Gastroenterology.  She did undergo  EGD on January 21, 2009.  She was noted to have retained food in her  abdomen after an overnight fast.  A gastric emptying study was done  subsequently, which was found to be normal.  She however was maintained  on a PPI agent, which helped her GI symptoms over time.   Over the period of her hospital stay, her pain medications were  gradually tapered down with as much as possible.  With the above-noted  problems, she eventually was managed on a stable regimen and was felt to  be stable for discharge.  At the time of discharge, the  patient was  ambulatory.  Her electrolyte picture had improved.  There was no  evidence for active infection.  Her mood has stabilized as well.  It was  felt that she will need further outpatient monitoring per Psychiatry as  well as Rheumatology and Neurology as well for chronic neck pain.  It  was noted that with her combination of medications, the patient's level  of alertness will be such that she would not be able to be gainfully  employed at this time.   MEDICATIONS AT THE TIME OF DISCHARGE:  1. Seroquel 100 mg p.o. at bedtime.  2. Xyzal 5 mg p.o. b.i.d.  3. Singulair 10 mg daily.  4. Colchicine 0.6 mg b.i.d.  5. Advair 250/50 mg 1 puff b.i.d.  6. Tramadol 50 mg q.i.d.  7. Multivitamins one daily.  8. Os-Cal D 500 mg b.i.d.  9. Pepcid 20 mg b.i.d.  10.Vistaril 25-50 mg q.4 h. P.r.n.  11.Protonix 40 mg b.i.d.  12.Prednisone 5 mg daily with taper.  13.Restoril 30 mg at bedtime p.r.n. sleep.  14.Flomax 0.4 mg p.o. at bedtime.  15.Soma 350 mg t.i.d. p.r.n. muscle spasms.  16.K-Dur 20 mEq b.i.d.  17.Reglan 5 mg before meals.  18.MiraLax 17 g daily p.r.n.  19.She will be finishing a course of amoxicillin at 875 mg b.i.d. for      5 days.   The patient is to be seen in the office in 2 weeks' time.  She will need  followup appointment with Dr. Zenovia Jordan of Rheumatology.  She will  need outpatient psychological followup as well.  This could be arranged  through the intensive outpatient department.  She will need GI followup  with Dr. Jeani Hawking as well.           ______________________________  Lind Guest. August Saucer, M.D.     ELD/MEDQ  D:  03/10/2009  T:  03/11/2009  Job:  [id number]

## 2010-11-25 NOTE — Consult Note (Signed)
NAMEWILHELMENA, ZEA          ACCOUNT NO.:  [id number]   MEDICAL RECORD NO.:  [id number]          PATIENT TYPE:  INP   LOCATION:  5731                         FACILITY:  [facility]   PHYSICIAN:  Leonie Man, M.D.   DATE OF BIRTH:  02-01-1971   DATE OF CONSULTATION:  08/15/2004  DATE OF DISCHARGE:                                   CONSULTATION   REFERRING PHYSICIAN:  Eric L. August Saucer, M.D.   PROBLEM:  Biliary dyskinesia.   HISTORY:  The patient is a 40 year old woman who approximately two months  ago started having epigastric and right upper quadrant pain associated with  nausea with some vomiting. This persisted through a fairly extensive  evaluation and she was started on PPIs with no relief of her symptoms. She  had a gallbladder ultrasound which showed a contracted gallbladder.  On  today's admission she had a HIDA scan with CCK stimulation which showed an  ejection fraction of zero since.  Since her  symptoms have begun, the  patient showed approximately 20 pounds with lost. Her liver function studies  are all within normal limits except for mild decrease in her serum albumin,  probably due to her starvation.  The remainder of her chemistries are all  within normal limits.   PAST MEDICAL HISTORY:  1.  Surgery the patient has undergone nasal reconstruction x2.  2.  She had bilateral breast implants x2.  3.  She has had bilateral tubal ligation.  4.  As a child, she had dilatation of urethral stricture.   CURRENT MEDICATIONS:  1.  Nexium.  2.  Zantac.  3.  Ambien.  4.  Ativan.   She has a does have a history of bronchial asthma which is under good  control. She takes p.r.n.'s.  She takes Combivent, Xopenex and Singulair.   ALLERGIES:  She is allergic to SULFA, TYLOX, CODEINE and ZOLOFT.   SUBSTANCE ALLERGIES:  IODINE, STRAWBERRIES, SOY SAUCE and SHELLFISH.   SOCIAL HISTORY:  This is a divorced white female with children. She does  have a current significant partner.   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  She  works as an Insurance underwriter. She does not use tobacco products and she does take  alcohol moderately.   REVIEW OF SYSTEMS:  Negative in detail except as outlined above.   PHYSICAL EXAMINATION:  GENERAL APPEARANCE:  She is a well-developed of well-  nourished female in no acute distress.  VITAL SIGNS:  Temperature is 97,  heart rate 88, respirations 20 and her  blood pressure is 122/76. The patient stands at 5 feet 4-1/2, weighing 127  pounds.  HEENT: Head is normocephalic. Pupils were round and regular. There is no  scleral icterus. No nasal instruction.  Benign oropharynx neck.  NECK:  Supple.  There is no thyromegaly. No cervical adenopathy. She does  have a somewhat slide mandibles.  LUNGS:  Clear to auscultation bilateral.  BREASTS:  Not examined.  CHEST:  Clear to auscultation.  HEART:  Regular rate and rhythm. There are no murmurs or gallop rhythms  heard.  ABDOMEN:  Soft,  nontender, nondistended with normoactive  bowel sounds.  There are no masses or visceromegaly felt.  EXTREMITIES: Range of motion is within normal limits.  There was no calf  tenderness or ankle edema.  NEUROLOGIC:  Gross neurological screening examination shows no motor or  sensory deficits and is fully alert and oriented female..   ASSESSMENT:  1.  Biliary dyskinesia.  2.  History of anxiety disorder.  3.  History of bronchial asthma.   PLAN:  Recommend that she undergo laparoscopic cholecystectomy with  intraoperative cholangiogram.  I discussed the risks and potential benefits  of surgery with the patient. She understands and she gives her consent to  surgery.      PB/MEDQ  D:  08/15/2004  T:  08/15/2004  Job:  [id number]

## 2010-11-25 NOTE — Op Note (Signed)
[name], [name]          ACCOUNT NO.:  [id number]   MEDICAL RECORD NO.:  [id number]          PATIENT TYPE:  INP   LOCATION:  5731                         FACILITY:  [facility]   PHYSICIAN:  Leonie Man, M.D.   DATE OF BIRTH:  03/27/1971   DATE OF PROCEDURE:  08/16/2004  DATE OF DISCHARGE:                                 OPERATIVE REPORT   PREOPERATIVE DIAGNOSIS:  Biliary dyskinesia.   POSTOPERATIVE DIAGNOSIS:  Biliary dyskinesia.   PROCEDURE:  Laparoscopic cholecystectomy with intraoperative cholangiogram.   SURGEON:  Leonie Man, M.D.   ASSISTANT:  Magnus Ivan, RNFA.   ANESTHESIA:  General.   NOTE:  The patient is a 40 year old woman with increasing epigastric and  right quadrant pain associated with nausea and with vomiting. This occurs  after eating meals. She had a gallbladder ultrasound which showed a  gallbladder without stones. HIDA scan with CCK stimulation showed a 0  gallbladder ejection fraction. The patient comes to the operating room now  after the risks and potential benefits of surgery have been discussed, all  of her questions answered and consent obtained.   PROCEDURE:  Following the induction of satisfactory general anesthesia with  the patient positioned supinely, the abdomen is routinely prepped and draped  to be included in a sterile operative field. Open laparoscopy created at the  umbilicus with insufflation of the peritoneal cavity to 14 mmHg pressure and  camera inserted. The visual exploration of the abdomen showed the  gallbladder to be somewhat tense, but otherwise appeared normal. The liver  edges were sharp, liver surfaces smooth.  Anterior gastric wall and duodenal  sweep appeared to be normal. None of the small or large intestine viewed  appeared to be abnormal. There were some adhesions in the pelvis. Pelvic  organs were not visualized.   Under direct vision, epigastric and lateral ports were placed. The  gallbladder was grasped  and retracted cephalad, and dissection carried down  the region of the ampulla with isolation of the cystic artery and cystic  duct. Cystic artery doubly clipped and transected. The cystic duct was  clipped proximally and opened. A cystic duct cholangiogram was carried out  by passing a Cook catheter into the abdomen and injecting into the cystic  duct one-half-strength Hypaque. The resulting cholangiogram showed prompt  flow of contrast into the duodenum with no filling defects. The [facility] was then removed and the cystic duct was triply clipped  and transected. The gallbladder was then removed from the liver bed by  electrocautery, maintaining hemostasis throughout the entire course of  dissection. At the end of the dissection, the liver bed was again inspected  for hemostasis and noted to be dry. The gallbladder was placed in a bag and  retrieved through the umbilical port without difficulty. Sponge and  instrument counts were verified. The right upper quadrant was thoroughly  irrigated with multiple aliquots of saline and sucked dry. The  pneumoperitoneum was then allowed to deflate and the wounds closed in layers  as  follows: The umbilical wound in 2 layers with 0 Vicryl and 4-0 Monocryl,  epigastrium and lateral flank  wounds closed with 4-0 Monocryl, all wounds  reinforced with Steri-Strips, sterile dressings applied, anesthetic  reversed, patient removed from the operating room to the recovery room in  stable condition. She tolerated the procedure well.      PB/MEDQ  D:  08/16/2004  T:  08/16/2004  Job:  [id number]   cc:   Minerva Areola L. August Saucer, M.D.  [address] [id number]  [place]  [place] [address]  Fax: [phone]

## 2014-07-26 ENCOUNTER — Observation Stay (HOSPITAL_COMMUNITY): Payer: Medicare Other

## 2014-07-26 ENCOUNTER — Encounter (HOSPITAL_COMMUNITY): Payer: Self-pay | Admitting: Internal Medicine

## 2014-07-26 ENCOUNTER — Other Ambulatory Visit: Payer: Self-pay | Admitting: Internal Medicine

## 2014-07-26 ENCOUNTER — Observation Stay (HOSPITAL_COMMUNITY)
Admission: AD | Admit: 2014-07-26 | Discharge: 2014-07-30 | Disposition: A | Discharge status: Home / Self Care | Payer: Medicare Other | Source: Ambulatory Visit | Attending: Internal Medicine | Admitting: Internal Medicine

## 2014-07-26 DIAGNOSIS — R200 Anesthesia of skin: Secondary | ICD-10-CM

## 2014-07-26 DIAGNOSIS — F319 Bipolar disorder, unspecified: Secondary | ICD-10-CM | POA: Insufficient documentation

## 2014-07-26 DIAGNOSIS — R296 Repeated falls: Secondary | ICD-10-CM | POA: Diagnosis present

## 2014-07-26 DIAGNOSIS — Z87442 Personal history of urinary calculi: Secondary | ICD-10-CM | POA: Insufficient documentation

## 2014-07-26 DIAGNOSIS — M5030 Other cervical disc degeneration, unspecified cervical region: Secondary | ICD-10-CM | POA: Diagnosis not present

## 2014-07-26 DIAGNOSIS — E039 Hypothyroidism, unspecified: Secondary | ICD-10-CM | POA: Insufficient documentation

## 2014-07-26 DIAGNOSIS — L4050 Arthropathic psoriasis, unspecified: Secondary | ICD-10-CM | POA: Diagnosis not present

## 2014-07-26 DIAGNOSIS — M545 Low back pain, unspecified: Secondary | ICD-10-CM | POA: Diagnosis present

## 2014-07-26 DIAGNOSIS — J45909 Unspecified asthma, uncomplicated: Secondary | ICD-10-CM | POA: Insufficient documentation

## 2014-07-26 DIAGNOSIS — G629 Polyneuropathy, unspecified: Secondary | ICD-10-CM

## 2014-07-26 DIAGNOSIS — I7300 Raynaud's syndrome without gangrene: Secondary | ICD-10-CM | POA: Diagnosis not present

## 2014-07-26 DIAGNOSIS — M5136 Other intervertebral disc degeneration, lumbar region: Secondary | ICD-10-CM | POA: Diagnosis not present

## 2014-07-26 DIAGNOSIS — M5440 Lumbago with sciatica, unspecified side: Secondary | ICD-10-CM

## 2014-07-26 DIAGNOSIS — R51 Headache: Secondary | ICD-10-CM | POA: Diagnosis not present

## 2014-07-26 DIAGNOSIS — Z79899 Other long term (current) drug therapy: Secondary | ICD-10-CM | POA: Insufficient documentation

## 2014-07-26 DIAGNOSIS — L508 Other urticaria: Secondary | ICD-10-CM | POA: Diagnosis not present

## 2014-07-26 DIAGNOSIS — R55 Syncope and collapse: Secondary | ICD-10-CM | POA: Diagnosis not present

## 2014-07-26 DIAGNOSIS — E876 Hypokalemia: Secondary | ICD-10-CM | POA: Insufficient documentation

## 2014-07-26 DIAGNOSIS — M797 Fibromyalgia: Secondary | ICD-10-CM | POA: Diagnosis not present

## 2014-07-26 DIAGNOSIS — Z885 Allergy status to narcotic agent status: Secondary | ICD-10-CM | POA: Insufficient documentation

## 2014-07-26 DIAGNOSIS — Z883 Allergy status to other anti-infective agents status: Secondary | ICD-10-CM | POA: Insufficient documentation

## 2014-07-26 DIAGNOSIS — Z87898 Personal history of other specified conditions: Secondary | ICD-10-CM

## 2014-07-26 DIAGNOSIS — F411 Generalized anxiety disorder: Secondary | ICD-10-CM | POA: Insufficient documentation

## 2014-07-26 DIAGNOSIS — R519 Headache, unspecified: Secondary | ICD-10-CM | POA: Diagnosis present

## 2014-07-26 DIAGNOSIS — W19XXXA Unspecified fall, initial encounter: Secondary | ICD-10-CM

## 2014-07-26 DIAGNOSIS — M5410 Radiculopathy, site unspecified: Secondary | ICD-10-CM

## 2014-07-26 HISTORY — DX: Unspecified asthma, uncomplicated: J45.909

## 2014-07-26 HISTORY — DX: Panic disorder (episodic paroxysmal anxiety): F41.0

## 2014-07-26 HISTORY — DX: Fibromyalgia: M79.7

## 2014-07-26 HISTORY — DX: Other cervical disc degeneration, unspecified cervical region: M50.30

## 2014-07-26 HISTORY — DX: Bipolar disorder, unspecified: F31.9

## 2014-07-26 HISTORY — DX: Other urticaria: L50.8

## 2014-07-26 HISTORY — DX: Unspecified convulsions: R56.9

## 2014-07-26 HISTORY — DX: Calculus of kidney: N20.0

## 2014-07-26 HISTORY — DX: Arthropathic psoriasis, unspecified: L40.50

## 2014-07-26 HISTORY — DX: Other intervertebral disc degeneration, lumbar region: M51.36

## 2014-07-26 HISTORY — DX: Raynaud's syndrome without gangrene: I73.00

## 2014-07-26 HISTORY — DX: Hypothyroidism, unspecified: E03.9

## 2014-07-26 HISTORY — DX: Other intervertebral disc degeneration, lumbar region without mention of lumbar back pain or lower extremity pain: M51.369

## 2014-07-26 HISTORY — DX: Migraine, unspecified, not intractable, without status migrainosus: G43.909

## 2014-07-26 LAB — BASIC METABOLIC PANEL
ANION GAP: 6 (ref 5–15)
CALCIUM: 8.9 mg/dL (ref 8.4–10.5)
CO2: 28 mmol/L (ref 19–32)
Chloride: 107 mEq/L (ref 96–112)
Creatinine, Ser: 0.86 mg/dL (ref 0.50–1.10)
GFR calc Af Amer: >90 mL/min (ref >90)
GFR calc non Af Amer: 80 mL/min — ABNORMAL LOW (ref >90)
GLUCOSE: 107 mg/dL — AB (ref 70–99)
Potassium: 3.1 mmol/L — ABNORMAL LOW (ref 3.5–5.1)
SODIUM: 141 mmol/L (ref 135–145)

## 2014-07-26 LAB — TSH: TSH: 0.194 u[IU]/mL — AB (ref 0.350–4.500)

## 2014-07-26 LAB — RAPID URINE DRUG SCREEN, HOSP PERFORMED
Amphetamines: NOT DETECTED
BENZODIAZEPINES: POSITIVE — AB
Barbiturates: NOT DETECTED
COCAINE: NOT DETECTED
Opiates: POSITIVE — AB
Tetrahydrocannabinol: NOT DETECTED

## 2014-07-26 LAB — MAGNESIUM: MAGNESIUM: 1.9 mg/dL (ref 1.5–2.5)

## 2014-07-26 MED ORDER — ENOXAPARIN SODIUM 40 MG/0.4ML ~~LOC~~ SOLN
40.0000 mg | SUBCUTANEOUS | Status: DC
  Administered 2014-07-26 – 2014-07-29 (×4): 40 mg via SUBCUTANEOUS
  Filled 2014-07-26 (×4): qty 0.4

## 2014-07-26 MED ORDER — ALPRAZOLAM 0.5 MG PO TABS
1.0000 mg | ORAL_TABLET | Freq: Three times a day (TID) | ORAL | Status: DC
Start: 2014-07-26 — End: 2014-07-30
  Administered 2014-07-26 – 2014-07-30 (×11): 1 mg via ORAL
  Filled 2014-07-26 (×11): qty 2

## 2014-07-26 MED ORDER — ONDANSETRON HCL 4 MG PO TABS: 4.0000 mg | ORAL_TABLET | Freq: Four times a day (QID) | ORAL | Status: DC | PRN

## 2014-07-26 MED ORDER — MAGNESIUM HYDROXIDE 400 MG/5ML PO SUSP: 30.0000 mL | Freq: Every day | ORAL | Status: DC | PRN

## 2014-07-26 MED ORDER — ACETAMINOPHEN 650 MG RE SUPP: 650.0000 mg | Freq: Four times a day (QID) | RECTAL | Status: DC | PRN

## 2014-07-26 MED ORDER — QUETIAPINE FUMARATE 50 MG PO TABS
450.0000 mg | ORAL_TABLET | Freq: Every day | ORAL | Status: DC
  Administered 2014-07-26: 450 mg via ORAL
  Filled 2014-07-26: qty 2
  Filled 2014-07-26: qty 1

## 2014-07-26 MED ORDER — ALBUTEROL SULFATE (2.5 MG/3ML) 0.083% IN NEBU: 2.5000 mg | INHALATION_SOLUTION | RESPIRATORY_TRACT | Status: DC | PRN

## 2014-07-26 MED ORDER — MORPHINE SULFATE ER 15 MG PO TBCR
15.0000 mg | EXTENDED_RELEASE_TABLET | Freq: Two times a day (BID) | ORAL | Status: DC
  Administered 2014-07-26 – 2014-07-30 (×8): 15 mg via ORAL
  Filled 2014-07-26 (×8): qty 1

## 2014-07-26 MED ORDER — POTASSIUM CHLORIDE CRYS ER 20 MEQ PO TBCR
40.0000 meq | EXTENDED_RELEASE_TABLET | Freq: Two times a day (BID) | ORAL | Status: AC
  Administered 2014-07-26 – 2014-07-27 (×3): 40 meq via ORAL
  Filled 2014-07-26 (×3): qty 2

## 2014-07-26 MED ORDER — TEMAZEPAM 15 MG PO CAPS
15.0000 mg | ORAL_CAPSULE | Freq: Every day | ORAL | Status: DC
  Administered 2014-07-26: 15 mg via ORAL
  Filled 2014-07-26: qty 1

## 2014-07-26 MED ORDER — ONDANSETRON HCL 4 MG/2ML IJ SOLN: 4.0000 mg | Freq: Four times a day (QID) | INTRAMUSCULAR | Status: DC | PRN

## 2014-07-26 MED ORDER — FLUTICASONE PROPIONATE 50 MCG/ACT NA SUSP
2.0000 | Freq: Every day | NASAL | Status: DC
  Administered 2014-07-27 – 2014-07-30 (×5): 2 via NASAL
  Filled 2014-07-26: qty 16

## 2014-07-26 MED ORDER — LORATADINE 10 MG PO TABS
10.0000 mg | ORAL_TABLET | Freq: Every day | ORAL | Status: DC
  Administered 2014-07-27 – 2014-07-30 (×4): 10 mg via ORAL
  Filled 2014-07-26 (×4): qty 1

## 2014-07-26 MED ORDER — SODIUM CHLORIDE 0.9 % IJ SOLN
3.0000 mL | Freq: Two times a day (BID) | INTRAMUSCULAR | Status: DC
  Administered 2014-07-26 – 2014-07-28 (×3): 3 mL via INTRAVENOUS

## 2014-07-26 MED ORDER — ACETAMINOPHEN 325 MG PO TABS
650.0000 mg | ORAL_TABLET | Freq: Four times a day (QID) | ORAL | Status: DC | PRN
  Administered 2014-07-28: 650 mg via ORAL
  Filled 2014-07-26: qty 2

## 2014-07-26 MED ORDER — DM-GUAIFENESIN ER 30-600 MG PO TB12
1.0000 | ORAL_TABLET | Freq: Two times a day (BID) | ORAL | Status: DC
  Administered 2014-07-26 – 2014-07-30 (×8): 1 via ORAL
  Filled 2014-07-26 (×8): qty 1

## 2014-07-26 MED ORDER — ALUM & MAG HYDROXIDE-SIMETH 200-200-20 MG/5ML PO SUSP: 30.0000 mL | Freq: Four times a day (QID) | ORAL | Status: DC | PRN

## 2014-07-26 MED ORDER — LEVOTHYROXINE SODIUM 100 MCG PO TABS
100.0000 ug | ORAL_TABLET | Freq: Every day | ORAL | Status: DC
  Administered 2014-07-27: 100 ug via ORAL
  Filled 2014-07-26: qty 1

## 2014-07-26 MED ORDER — ADULT MULTIVITAMIN W/MINERALS CH
1.0000 | ORAL_TABLET | Freq: Every day | ORAL | Status: DC
  Administered 2014-07-27 – 2014-07-30 (×4): 1 via ORAL
  Filled 2014-07-26 (×4): qty 1

## 2014-07-26 MED ORDER — DOCUSATE SODIUM 100 MG PO CAPS
100.0000 mg | ORAL_CAPSULE | Freq: Two times a day (BID) | ORAL | Status: DC
  Administered 2014-07-26 – 2014-07-30 (×8): 100 mg via ORAL
  Filled 2014-07-26 (×8): qty 1

## 2014-07-26 NOTE — H&P (Addendum)
[facility] History and Physical  [name] ZOX:[id number] DOB: 09-Mar-1971 DOA: 07/26/2014  Referring physician: August Saucer PCP: August Saucer   Chief Complaint: falls, headache, low back pain  HPI: [name] is a 44 y.o. female  With h/o seizures, migraines, chronic pain, bipolar disorder, anxiety,  fibromyalgia, DDD s/p c spine and lumbar surgery in 2012, psoriatic arthritis, chronic urticaria, recently diagnosed hypothroidism directly admitted from Dr. Diamantina Providence office with multiple complaints.  Frequent falls, legs giving out, low back pain with radiation to both legs, frequent headaches, syncopal episode about a week ago, which she says was a "seizure".  Syncopal episode was unwitnessed, but patient reportedly lost consciousness.  Spoke to her mother by phone afterwards. Mother reports speech was slow, and slurred.  Lives in [place], but still sees Dr. August Saucer as PCP.  Admitted for MRI Barron spine and workup of syncope. Denies CP, palpitations, dyspnea.  Has had about once yearly seizures since childhood, no longer on AEDs. Does not see neurologist or rheumatologist. Only new medication is synthroid, about 2 months ago.  Denies drinking or drug use.  Also was told "borderline diabetes" and hyperlipidemia recently. Broke out in hives with statin, so she stopped.  On multiple sedating meds: Seroquel, xanax, restoril, MS Contin, and wishes she could wean off many.  Denies having run out of any.   Review of Systems:  Systems reviewed. As above, otherwise negative.  Past Medical History  Diagnosis Date  . Migraine   . Seizure   . DDD (degenerative disc disease), cervical   . DDD (degenerative disc disease), lumbar   . Psoriatic arthritis   . Chronic urticaria   . Bipolar disorder   . Fibromyalgia   . Raynaud's disease   . Panic attack   . Kidney stones   . Asthma   . Hypothyroidism    Past Surgical History  Procedure Laterality Date  . Cervical spine surgery    . Lumbar spine  surgery    . Partial hysterectomy      bleeding  . Tubal ligation    . Cholecystecomy    . Rhinoplasty    . Tonsillectomy and adenoidectomy    . Breast enhancement surgery     Social History:  reports that she drinks alcohol. Her tobacco and drug histories are not on file.  Allergies  Allergen Reactions  . Codeine Shortness Of Breath  . Iodine Rash    Family History  Problem Relation Age of Onset  . Depression Mother   . Depression Father   . Hyperlipidemia Mother   . Diabetes Father   . COPD Father   . Kidney disease Father   . CAD Father      Prior to Admission medications   Not on File   Physical Exam: Filed Vitals:   07/26/14 1556 07/26/14 1725  BP: 110/70 109/55  Pulse: 91 91  Temp: 98.2 F (36.8 C) 98.1 F (36.7 C)  TempSrc: Oral Oral  Resp: 18 20  Height: 5' 4.5" (1.638 m)   Weight: 68.402 kg (150 lb 12.8 oz)   SpO2: 100% 100%    Wt Readings from Last 3 Encounters:  07/26/14 68.402 kg (150 lb 12.8 oz)   BP 109/55 mmHg  Pulse 91  Temp(Src) 98.1 F (36.7 C) (Oral)  Resp 20  Ht 5' 4.5" (1.638 m)  Wt 68.402 kg (150 lb 12.8 oz)  BMI 25.49 kg/m2  SpO2 100%  General Appearance:    Alert, cooperative, no distress, appears stated age, talkative  Head:    Normocephalic, without obvious abnormality, atraumatic  Eyes:    PERRL, conjunctiva/corneas clear, EOM's intact  Nose:   Nares normal, septum midline, mucosa normal, no drainage    or sinus tenderness  Throat:   Lips, mucosa, and tongue normal; teeth and gums normal  Neck:   Supple, symmetrical, trachea midline, no adenopathy;    thyroid:  no enlargement/tenderness/nodules; no carotid   bruit or JVD  Back:     No point tenderness. No CVA tenderness. SLR elicits pain bilaterally  Lungs:     Clear to auscultation bilaterally, respirations unlabored  Chest Wall:    No tenderness or deformity   Heart:    Regular rate and rhythm, S1 and S2 normal, no murmur, rub   or gallop  Abdomen:     Soft,  non-tender, bowel sounds active all four quadrants,    no masses, no organomegaly  Genitalia:    deferred  Rectal:    deferred  Extremities:   Extremities normal, atraumatic, no cyanosis or edema  Pulses:   2+ and symmetric all extremities  Skin:   Skin color, texture, turgor normal, no rashes or lesions  Lymph nodes:   Cervical, supraclavicular, and axillary nodes normal  Neurologic:   Decreased sensation right face, otherwise CN intact.  Motor 5/5 throughout, sensation otherwise intact. Gait normal.    Psychiatric:  Cooperative, somewhat anxious, meanders when answering questions            Labs on Admission:  Basic Metabolic Panel: BMET    Component Value Date/Time   NA 141 07/26/2014 1840   K 3.1* 07/26/2014 1840   CL 107 07/26/2014 1840   CO2 28 07/26/2014 1840   GLUCOSE 107* 07/26/2014 1840   BUN <5* 07/26/2014 1840   CREATININE 0.86 07/26/2014 1840   CALCIUM 8.9 07/26/2014 1840   GFRNONAA 80* 07/26/2014 1840   GFRAA >90 07/26/2014 1840    Liver Function Tests: No results for input(s): AST, ALT, ALKPHOS, BILITOT, PROT, ALBUMIN in the last 168 hours. No results for input(s): LIPASE, AMYLASE in the last 168 hours. No results for input(s): AMMONIA in the last 168 hours. CBC: No results for input(s): WBC, NEUTROABS, HGB, HCT, MCV, PLT in the last 168 hours. Cardiac Enzymes: No results for input(s): CKTOTAL, CKMB, CKMBINDEX, TROPONINI in the last 168 hours.  BNP (last 3 results) No results for input(s): PROBNP in the last 8760 hours. CBG: No results for input(s): GLUCAP in the last 168 hours.  Radiological Exams on Admission: No results found.  Assessment/Plan Principal Problem:   Syncope: possibly seizure. Will get EEG, monitor on tele. Check EKGNot on AEDs. Per patient, had allergic reaction to dilantin, and no more than one per year.  With worsening headaches, will also get CT brain.  Neuro exam nonfocal Observation status. Active Problems:   Falls/Low back  pain with radiculopathy/DDD: get MRI Barron spine. Check b12, folate, UDS. Also need to consider medication related. Takes seroquel 450 nightly restoril 15-45 mg nightly, xanax 1 mg tid, MS Contin 15 mg bid. Denies EtOH or drugs.   Headaches   History of seizures   Hypothyroidism: recently started on synthroid. Check TSH   Hypokalemia: may be contributing to symptoms. Replete and check magnesium   Bipolar disorder   Generalized anxiety disorder   Chronic urticaria   Asthma, chronic: no wheeze   [name] [facility]

## 2014-07-26 NOTE — Progress Notes (Signed)
Received as direct admit  From admitting; patient alert and oriented; no acute distress; paged [facility] for admission; vital signs taken; patient oriented to room and unit routine.  Patient's mother is at the bedside.

## 2014-07-27 ENCOUNTER — Observation Stay (HOSPITAL_COMMUNITY): Payer: Medicare Other

## 2014-07-27 DIAGNOSIS — R51 Headache: Secondary | ICD-10-CM

## 2014-07-27 DIAGNOSIS — Z8669 Personal history of other diseases of the nervous system and sense organs: Secondary | ICD-10-CM

## 2014-07-27 DIAGNOSIS — R55 Syncope and collapse: Secondary | ICD-10-CM | POA: Diagnosis not present

## 2014-07-27 LAB — LIPID PANEL
CHOL/HDL RATIO: 4.9 ratio
Cholesterol: 220 mg/dL — ABNORMAL HIGH (ref 0–200)
HDL: 45 mg/dL (ref >39)
LDL Cholesterol: 156 mg/dL — ABNORMAL HIGH (ref 0–99)
Triglycerides: 95 mg/dL (ref <150)
VLDL: 19 mg/dL (ref 0–40)

## 2014-07-27 LAB — CBC
HCT: 33.4 % — ABNORMAL LOW (ref 36.0–46.0)
Hemoglobin: 11.5 g/dL — ABNORMAL LOW (ref 12.0–15.0)
MCH: 29.3 pg (ref 26.0–34.0)
MCHC: 34.4 g/dL (ref 30.0–36.0)
MCV: 85.2 fL (ref 78.0–100.0)
Platelets: 215 10*3/uL (ref 150–400)
RBC: 3.92 MIL/uL (ref 3.87–5.11)
RDW: 12.2 % (ref 11.5–15.5)
WBC: 4.8 10*3/uL (ref 4.0–10.5)

## 2014-07-27 LAB — HEMOGLOBIN A1C
HEMOGLOBIN A1C: 5.6 % (ref <5.7)
MEAN PLASMA GLUCOSE: 114 mg/dL (ref <117)

## 2014-07-27 LAB — VITAMIN B12: Vitamin B-12: 768 pg/mL (ref 211–911)

## 2014-07-27 MED ORDER — LEVOTHYROXINE SODIUM 88 MCG PO TABS
88.0000 ug | ORAL_TABLET | Freq: Every day | ORAL | Status: DC
  Administered 2014-07-28 – 2014-07-30 (×3): 88 ug via ORAL
  Filled 2014-07-27 (×3): qty 1

## 2014-07-27 MED ORDER — TEMAZEPAM 15 MG PO CAPS
30.0000 mg | ORAL_CAPSULE | Freq: Every day | ORAL | Status: DC
  Administered 2014-07-27 – 2014-07-29 (×3): 30 mg via ORAL
  Filled 2014-07-27 (×3): qty 2

## 2014-07-27 MED ORDER — QUETIAPINE FUMARATE 50 MG PO TABS
300.0000 mg | ORAL_TABLET | Freq: Every day | ORAL | Status: DC
  Administered 2014-07-27 – 2014-07-29 (×3): 300 mg via ORAL
  Filled 2014-07-27 (×2): qty 2
  Filled 2014-07-27 (×2): qty 1
  Filled 2014-07-27: qty 2
  Filled 2014-07-27: qty 1

## 2014-07-27 MED ORDER — GADOBENATE DIMEGLUMINE 529 MG/ML IV SOLN
10.0000 mL | Freq: Once | INTRAVENOUS | Status: AC | PRN
  Administered 2014-07-27: 10 mL via INTRAVENOUS

## 2014-07-27 NOTE — Procedures (Cosign Needed)
ELECTROENCEPHALOGRAM REPORT   Patient: [name]       Room #: 4N-04 EEG No. ID: [id number] Age: 44 y.o.        Sex: female Referring Physician: Dr Lendell CapriceSullivan Report Date:  07/27/2014        Interpreting Physician: Elspeth ChoSUMNER, Mildred Tuccillo, Jill AlexandersJUSTIN  History: Rachel KelchCharlotte Matthes is an 44 y.o. female episode of LOC with post event dysarthria.   Medications:  Scheduled: . ALPRAZolam  1 mg Oral TID  . dextromethorphan-guaiFENesin  1 tablet Oral BID  . docusate sodium  100 mg Oral BID  . enoxaparin (LOVENOX) injection  40 mg Subcutaneous Q24H  . fluticasone  2 spray Each Nare Daily  . levothyroxine  100 mcg Oral QAC breakfast  . loratadine  10 mg Oral Daily  . morphine  15 mg Oral Q12H  . multivitamin with minerals  1 tablet Oral Daily  . potassium chloride  40 mEq Oral BID  . QUEtiapine  450 mg Oral QHS  . sodium chloride  3 mL Intravenous Q12H  . temazepam  15 mg Oral QHS    Conditions of Recording:  This is a 16 channel EEG carried out with the patient in the drowsy state.  Description:  The waking background activity consists of a low voltage, symmetrical, fairly well organized, 10-11 Hz alpha activity, seen from the parieto-occipital and posterior temporal regions.  Low voltage fast activity, poorly organized, is seen anteriorly and is at times superimposed on more posterior regions.  A mixture of theta and alpha rhythms are seen from the central and temporal regions. No focal slowing, sharp waves or epileptiform activity noted.   The patient drowses with slowing to irregular, low voltage theta and beta activity. Stage II sleep is not obtained. Hyperventilation was not performed. Intermittent photic stimulation was performed but failed to illicit any change in the tracing.    IMPRESSION: Normal drowsy EEG. A normal EEG does not rule out an underlying seizure disorder.   Elspeth Choeter Joshaua Epple, DO [facility] [phone]  If 7pm- 7am, please page neurology on call as listed in  AMION. 07/27/2014, 11:30 AM

## 2014-07-27 NOTE — Progress Notes (Signed)
UR completed 

## 2014-07-27 NOTE — Progress Notes (Signed)
EEG Completed; Results Pending  

## 2014-07-27 NOTE — Evaluation (Signed)
Physical Therapy Evaluation Patient Details Name: [name] MRN: [id number] DOB: 07-19-1970 Today's Date: 07/27/2014   History of Present Illness  Patient is a 44 y/o female with PMH of  h/o seizures, migraines, chronic pain, bipolar disorder, anxiety,  fibromyalgia, DDD s/p c spine and lumbar surgery in 2012, psoriatic arthritis, chronic urticaria, recently diagnosed hypothroidism directly admitted from Dr. Diamantina [place] office with multiple complaints- legs giving out, falls, radiating pain in LLE, frequent headaches and syncopal episode about a week ago. Pt admitted with episode of LOC with post event dysarthria. MR lumbar spine-Slight worsening of mild subarticular and foraminal narrowing B L4-5, recurrent rightward disc protrusion at L5-S1, Mild subarticular narrowing at L5-S1 is worse on the right.     Clinical Impression  Patient presents with functional limitations due to deficits listed in PT problem list (see below). Pt with pain, generalized weakness LLE>RLE, balance deficits and unpredictability of LLE "giving out" putting pt at increased risk for falls. Pt has been dealing with chronic back pain, radiation of pain down LLE since surgery ~4 years ago. Not able to perform IADLs or play with kids PTA due to pain and weakness in LLE. Multiple falls daily. Pt would benefit from skilled PT to improve transfers, gait, balance and overall mobility so pt can maximize independence and minimize fall risk prior to return home.    Follow Up Recommendations Outpatient PT;Supervision - Intermittent (neuro OPPT)     Equipment Recommendations  None recommended by PT    Recommendations for Other Services OT consult     Precautions / Restrictions Precautions Precautions: Fall Precaution Comments: Pt reports multiple falls at home daily with LLE giving out- unpredictable. Restrictions Weight Bearing Restrictions: No      Mobility  Bed Mobility Overal bed mobility: Modified Independent Bed  Mobility: Supine to Sit;Sit to Supine     Supine to sit: HOB elevated;Modified independent (Device/Increase time) Sit to supine: HOB elevated;Modified independent (Device/Increase time)      Transfers Overall transfer level: Needs assistance Equipment used: None Transfers: Sit to/from Stand Sit to Stand: Min guard         General transfer comment: Min guard due to unpredictability of LLE.  Ambulation/Gait Ambulation/Gait assistance: Min guard Ambulation Distance (Feet): 50 Feet Assistive device: None Gait Pattern/deviations: Step-through pattern;Decreased stride length;Narrow base of support Gait velocity: slow   General Gait Details: Slow, unsteady gait with 1 LOB during a turn needing to grab onto counter for support. Reports no warning signs when LLE is going to "give out"  Stairs            Wheelchair Mobility    Modified Rankin (Stroke Patients Only)       Balance Overall balance assessment: Needs assistance Sitting-balance support: Feet supported;No upper extremity supported Sitting balance-Leahy Scale: Good Sitting balance - Comments: Able to reach outside BoS and donn shoes withou difficulty.    Standing balance support: During functional activity Standing balance-Leahy Scale: Fair                               Pertinent Vitals/Pain Pain Assessment: 0-10 Pain Score:  (not rated on pain scale.) Pain Location: Low back/radiating down LLE. Pain Descriptors / Indicators: Sore;Shooting;Constant Pain Intervention(s): Limited activity within patient's tolerance;Repositioned;Monitored during session    Home Living Family/patient expects to be discharged to:: Private residence Living Arrangements: Spouse/significant other;Children Available Help at Discharge: Family;Available PRN/intermittently Type of Home: House Home Access: Level entry  Home Layout: Two level;Able to live on main level with bedroom/bathroom Home Equipment: Dan HumphreysWalker -  2 wheels;Cane - single point      Prior Function Level of Independence: Independent         Comments: Pt reports not able to do any IADLs. Mutiple falls daily. Some days she just stays in bed due to increased pain and knowing she cannot walk without falling. Reports this has been ongoing for 4 years.     Hand Dominance        Extremity/Trunk Assessment   Upper Extremity Assessment: Defer to OT evaluation;Generalized weakness (Weak grip bilaterally. Increased swelling Bil hands.)           Lower Extremity Assessment: Generalized weakness;LLE deficits/detail   LLE Deficits / Details: Impaired myotomes L4-5, L5-S1 with increased pain.      Communication   Communication: No difficulties  Cognition Arousal/Alertness: Awake/alert Behavior During Therapy: WFL for tasks assessed/performed Overall Cognitive Status: Within Functional Limits for tasks assessed                      General Comments General comments (skin integrity, edema, etc.): Education provided on purpose for follow up therapy pending decision on surgery.  Pt has tried PT, acupuncture, chiropractors and botox injections into spine prior to surgery.    Exercises        Assessment/Plan    PT Assessment Patient needs continued PT services  PT Diagnosis Difficulty walking;Generalized weakness;Acute pain   PT Problem List Decreased strength;Pain;Decreased balance;Decreased mobility;Decreased activity tolerance  PT Treatment Interventions Balance training;Gait training;Neuromuscular re-education;Functional mobility training;Patient/family education;Therapeutic activities;Therapeutic exercise   PT Goals (Current goals can be found in the Care Plan section) Acute Rehab PT Goals Patient Stated Goal: to be able to do things with my kids PT Goal Formulation: With patient Time For Goal Achievement: 08/10/14 Potential to Achieve Goals: Fair    Frequency Min 3X/week   Barriers to discharge         Co-evaluation               End of Session Equipment Utilized During Treatment: Gait belt Activity Tolerance: Patient limited by pain Patient left: in bed;with call bell/phone within reach;with family/visitor present Nurse Communication: Mobility status    Functional Assessment Tool Used: clinical judgment Functional Limitation: Mobility: Walking and moving around Mobility: Walking and Moving Around Current Status [phone](G8978): At least 1 percent but less than 20 percent impaired, limited or restricted Mobility: Walking and Moving Around Goal Status [phone](G8979): At least 1 percent but less than 20 percent impaired, limited or restricted    Time: 1315-1338 PT Time Calculation (min) (ACUTE ONLY): 23 min   Charges:   PT Evaluation $Initial PT Evaluation Tier I: 1 Procedure PT Treatments $Gait Training: 8-22 mins   PT G Codes:   PT G-Codes **NOT FOR INPATIENT CLASS** Functional Assessment Tool Used: clinical judgment Functional Limitation: Mobility: Walking and moving around Mobility: Walking and Moving Around Current Status ([id number](G8978): At least 1 percent but less than 20 percent impaired, limited or restricted Mobility: Walking and Moving Around Goal Status [phone](G8979): At least 1 percent but less than 20 percent impaired, limited or restricted    Alvie HeidelbergFolan, Miro Balderson A 07/27/2014, 1:51 PM  Alvie HeidelbergShauna Folan, PT, DPT [phone]

## 2014-07-27 NOTE — Progress Notes (Signed)
PROGRESS NOTE  [name] XBJ:[id number] DOB: [birth year] DOA: 07/26/2014 PCP: Willey Blade, MD  HPI: With h/o seizures, migraines, chronic pain, bipolar disorder, anxiety, fibromyalgia, DDD s/p c spine and lumbar surgery in 2012, psoriatic arthritis, chronic urticaria, recently diagnosed hypothroidism directly admitted from Dr. Diamantina [place] office with multiple complaints. Frequent falls, legs giving out, low back pain with radiation to both legs, frequent headaches, syncopal episode about a week ago, which she says was a "seizure". Syncopal episode was unwitnessed, but patient reportedly lost consciousness. Spoke to her mother by phone afterwards. Mother reports speech was slow, and slurred. Lives in [place], but still sees Dr. August Saucer as PCP. Admitted for MRI L spine and workup of syncope. Denies CP, palpitations, dyspnea. Has had about once yearly seizures since childhood, no longer on AEDs. Does not see neurologist or rheumatologist. Only new medication is synthroid, about 2 months ago. Denies drinking or drug use. Also was told "borderline diabetes" and hyperlipidemia recently. Broke out in hives with statin, so she stopped. On multiple sedating meds: Seroquel, xanax, restoril, MS Contin, and wishes she could wean off many. Denies having run out of any.  Subjective / 24 H Interval events - She has no complaints this morning, denies any chest pain or shortness of breath, she endorses lower extremity weakness/numbness which she has had for a while  Assessment/Plan: Principal Problem:   Syncope Active Problems:   Falls   Low back pain   Headache   History of seizures   Hypothyroidism   Hypokalemia   Bipolar disorder   Generalized anxiety disorder   Chronic urticaria   Asthma, chronic   Syncope: possibly seizure. - EEG unremarkable - Continue to monitor on telemetry for today to evaluate for arrhythmias  - CT of the brain unremarkable  Falls/Low back pain with  radiculopathy/DDD: - Patient did get an MRI of the L-spine, CT findings below, I have discussed with Dr. Lovell Sheehan from neurosurgery this findings, he does not see any need for any acute surgical intervention she can follow-up with him as an outpatient if she chooses to do so. I have also discussed with neurology over the phone and they recommended a nerve conduction studies which also can be arranged as an outpatient, I will make the referral and give the patient told information about outpatient neurology. - B12 level was normal at 768.  - complains of headaches, facial numbness without apparent territory, obtain brain MR Hypothyroidism - TSH is slightly low, decrease levothyroxine dose from 100 MCG to 88 MCG Headaches History of seizures Hypokalemia: may be contributing to symptoms. Replete and check magnesium Bipolar disorder Generalized anxiety disorder Chronic urticaria Asthma, chronic: no wheeze  Diet: Diet regular Fluids: none DVT Prophylaxis: Lovenox  Code Status: Full Code Family Communication: none  Disposition Plan: remain inpatient  Consultants:  Neurosurgery (phone)  Neurology (phone)  Procedures:  None    Antibiotics None    Studies  Ct Head Wo Contrast  07/26/2014   CLINICAL DATA:  Seizure  EXAM: CT HEAD WITHOUT CONTRAST  TECHNIQUE: Contiguous axial images were obtained from the base of the skull through the vertex without intravenous contrast.  COMPARISON:  01/12/2009  FINDINGS: The brainstem, cerebellum, cerebral peduncles, thalamus, basal ganglia, basilar cisterns, and ventricular system appear within normal limits. No intracranial hemorrhage, mass lesion, or acute CVA. Visualized paranasal sinuses appear clear.  IMPRESSION: 1.  No significant abnormality identified.   Electronically Signed   By: Herbie Baltimore M.D.   On: 07/26/2014 20:03  Mr Lumbar Spine Wo Contrast  07/26/2014   CLINICAL DATA:  Side headache syndrome. Radiculopathy of the left leg.  Falls. Low back pain. Pain extending into the left lower extremity following lumbar spine surgery. Numbness and burning to the toes.  EXAM: MRI LUMBAR SPINE WITHOUT CONTRAST  TECHNIQUE: Multiplanar, multisequence MR imaging of the lumbar spine was performed. No intravenous contrast was administered.  COMPARISON:  MRI lumbar spine 01/26/2009  FINDINGS: Normal signal is present in the conus medullaris which terminates at T12-L1, within normal limits. Marrow signal, vertebral body heights, and alignment are normal. Focal T2 higher the scratch the focal T2 hyperintensity anterior to the L2 vertebral body is stable, compatible with a benign and prominent cisterna chyli, a normal variant.  Limited imaging of the abdomen is otherwise unremarkable. There is no significant adenopathy.  The disc levels at all L3-4 and above are normal.  L4-5: A mild broad-based disc protrusion is similar to the prior exam. Mild subarticular and foraminal narrowing is minimally worse. Moderate facet hypertrophy is evident.  L5-S1: Anterior fusion is noted. A residual or recurrent rightward disc protrusion results in mild subarticular narrowing bilaterally, worse on the right. The left foramen is widely patent. Minimal right foraminal narrowing is due to progressive facet hypertrophy.  IMPRESSION: 1. Slight worsening of mild subarticular and foraminal narrowing bilaterally at L4-5. 2. Residual or recurrent rightward disc protrusion at L5-S1 following anterior fusion. 3. Mild subarticular narrowing at L5-S1 is worse on the right. 4. Minimal right foraminal narrowing due to progressive facet hypertrophy at L5-S1.   Electronically Signed   By: Gennette Pachris  Mattern M.D.   On: 07/26/2014 19:59   Mr Cervical Spine W Wo Contrast  07/27/2014   CLINICAL DATA:  Degenerative disc disease of cervical spine. Left-sided numbness and weakness. Unsteady gait with falls. Personal history of cervical spine fusion.  EXAM: MRI CERVICAL SPINE WITHOUT AND WITH CONTRAST   TECHNIQUE: Multiplanar and multiecho pulse sequences of the cervical spine, to include the craniocervical junction and cervicothoracic junction, were obtained according to standard protocol without and with intravenous contrast.  CONTRAST:  10mL MULTIHANCE GADOBENATE DIMEGLUMINE 529 MG/ML IV SOLN  COMPARISON:  MRI of the cervical spine 01/12/2009  FINDINGS: Normal signal is present in the cervical and upper thoracic spinal cord to the lowest imaged level, T1-2. Marrow signal and vertebral body heights are otherwise normal.  Craniocervical junction is within normal limits. The visualized intracranial contents are normal. Flow is present in the vertebral arteries bilaterally.  C2-3:  Negative.  C3-4: A rightward disc osteophyte complex partially effaces ventral CSF. Mild right foraminal narrowing is evident.  C4-5: Patient is fused anteriorly. Asymmetric right-sided uncovertebral spurring contributes to mild right foraminal stenosis. The central canal is patent.  C5-6: A broad-based disc osteophyte complex partially effaces the ventral CSF. The central canal and foramina are patent.  C6-7: Very shallow central disc protrusion is present without significant stenosis.  C7-T1:  Negative.  The postcontrast images demonstrate no pathologic enhancement.  IMPRESSION: 1. Anterior fusion at C4-5 with mild residual right foraminal narrowing due to uncovertebral spurring. 2. Mild right foraminal narrowing at C3-4 due to a rightward disc osteophyte complex and uncovertebral spurring. 3. Broad-based disc osteophyte complex with partial effacement of the ventral CSF at C5-6 but no significant stenosis. 4. Very shallow central disc protrusion at C6-7 without significant stenosis.   Electronically Signed   By: Gennette Pachris  Mattern M.D.   On: 07/27/2014 10:26   Objective  Filed Vitals:   07/27/14  1191 07/27/14 0633 07/27/14 1217 07/27/14 1340  BP: 96/86 101/57 99/57 105/61  Pulse: 98 74 84 86  Temp: 98.3 F (36.8 C) 97.8 F (36.6  C) 97.7 F (36.5 C) 97.9 F (36.6 C)  TempSrc: Oral Oral Oral   Resp: Height:      Weight:      SpO2: 96% 98% 100% 99%   No intake or output data in the 24 hours ending 07/27/14 1358 Filed Weights   07/26/14 1556  Weight: 68.402 kg (150 lb 12.8 oz)    Exam:  General:  NAD  HEENT: no scleral icterus  Cardiovascular: RRR  Respiratory: CTA biL  Abdomen: soft, non tender  Skin: no rashes  Neuro: non focal  Data Reviewed: Basic Metabolic Panel:  Recent Labs Lab 07/26/14 1840  NA 141  K 3.1*  CL 107  CO2 28  GLUCOSE 107*  BUN <5*  CREATININE 0.86  CALCIUM 8.9  MG 1.9   CBC:  Recent Labs Lab 07/27/14 0555  WBC 4.8  HGB 11.5*  HCT 33.4*  MCV 85.2  PLT 215    Scheduled Meds: . ALPRAZolam  1 mg Oral TID  . dextromethorphan-guaiFENesin  1 tablet Oral BID  . docusate sodium  100 mg Oral BID  . enoxaparin (LOVENOX) injection  40 mg Subcutaneous Q24H  . fluticasone  2 spray Each Nare Daily  . levothyroxine  100 mcg Oral QAC breakfast  . loratadine  10 mg Oral Daily  . morphine  15 mg Oral Q12H  . multivitamin with minerals  1 tablet Oral Daily  . potassium chloride  40 mEq Oral BID  . QUEtiapine  450 mg Oral QHS  . sodium chloride  3 mL Intravenous Q12H  . temazepam  15 mg Oral QHS   Continuous Infusions:   Pamella Pert, MD [facility] Pager [phone]. If 7 PM - 7 AM, please contact night-coverage at [email or web address], password [email or web address] 07/27/2014, 1:58 PM  LOS: 1 day

## 2014-07-28 DIAGNOSIS — R55 Syncope and collapse: Secondary | ICD-10-CM | POA: Diagnosis not present

## 2014-07-28 LAB — BASIC METABOLIC PANEL
Anion gap: 10 (ref 5–15)
BUN: 7 mg/dL (ref 6–23)
CHLORIDE: 106 meq/L (ref 96–112)
CO2: 21 mmol/L (ref 19–32)
Calcium: 9.1 mg/dL (ref 8.4–10.5)
Creatinine, Ser: 0.86 mg/dL (ref 0.50–1.10)
GFR calc Af Amer: >90 mL/min (ref >90)
GFR, EST NON AFRICAN AMERICAN: 80 mL/min — AB (ref >90)
GLUCOSE: 91 mg/dL (ref 70–99)
POTASSIUM: 4.2 mmol/L (ref 3.5–5.1)
SODIUM: 137 mmol/L (ref 135–145)

## 2014-07-28 LAB — FOLATE RBC
Folate, Hemolysate: 477 ng/mL
Folate, RBC: 1399 ng/mL (ref >498)
Hematocrit: 34.1 % (ref 34.0–46.6)

## 2014-07-28 NOTE — Progress Notes (Signed)
UR completed 

## 2014-07-28 NOTE — Progress Notes (Signed)
PROGRESS NOTE  [name] ZOX:[id number] DOB: 11-24-1970 DOA: 07/26/2014 PCP: Willey Blade, MD  HPI: With h/o seizures, migraines, chronic pain, bipolar disorder, anxiety, fibromyalgia, DDD s/p c spine and lumbar surgery in 2012, psoriatic arthritis, chronic urticaria, recently diagnosed hypothroidism directly admitted from Dr. Diamantina Providence office with multiple complaints. Frequent falls, legs giving out, low back pain with radiation to both legs, frequent headaches, syncopal episode about a week ago, which she says was a "seizure". Syncopal episode was unwitnessed, but patient reportedly lost consciousness. Spoke to her mother by phone afterwards. Mother reports speech was slow, and slurred. Lives in [place], but still sees Dr. August Saucer as PCP. Admitted for MRI L spine and workup of syncope. Denies CP, palpitations, dyspnea. Has had about once yearly seizures since childhood, no longer on AEDs. Does not see neurologist or rheumatologist. Only new medication is synthroid, about 2 months ago. Denies drinking or drug use. Also was told "borderline diabetes" and hyperlipidemia recently. Broke out in hives with statin, so she stopped. On multiple sedating meds: Seroquel, xanax, restoril, MS Contin, and wishes she could wean off many. Denies having run out of any.  Subjective / 24 H Interval events - feels at baseline this morning, no new complaints  Assessment/Plan: Principal Problem:   Syncope Active Problems:   Falls   Low back pain   Headache   History of seizures   Hypothyroidism   Hypokalemia   Bipolar disorder   Generalized anxiety disorder   Chronic urticaria   Asthma, chronic   Syncope: possibly seizure. - EEG unremarkable - CT of the brain unremarkable  - MRI pending, cannot be done today due to contrast with MRI yesterday Falls/Low back pain with radiculopathy/DDD: - Patient did get an MRI of the L-spine and C spine, CT findings below, I have discussed with Dr.  Lovell Sheehan from neurosurgery this findings, he does not see any need for any acute surgical intervention she can follow-up with him as an outpatient if she chooses to do so. I have also discussed with neurology over the phone and they recommended a nerve conduction studies which also can be arranged as an outpatient, I will make the referral and give the patient information about outpatient neurology. - B12 level was normal at 768.  - complains of headaches, facial numbness without apparent territory, obtain brain MR, to be done 1/20 per radiology  Hypothyroidism - TSH is slightly low, decreased levothyroxine dose from 100 MCG to 88 MCG Headaches History of seizures Hypokalemia: may be contributing to symptoms. Replete and check magnesium Bipolar disorder Generalized anxiety disorder Chronic urticaria Asthma, chronic: no wheeze  Diet: Diet regular Fluids: none DVT Prophylaxis: Lovenox  Code Status: Full Code Family Communication: none  Disposition Plan: remain inpatient  Consultants:  Neurosurgery (phone)  Neurology (phone)  Procedures:  None    Antibiotics None    Studies  Ct Head Wo Contrast  07/26/2014   CLINICAL DATA:  Seizure  EXAM: CT HEAD WITHOUT CONTRAST  TECHNIQUE: Contiguous axial images were obtained from the base of the skull through the vertex without intravenous contrast.  COMPARISON:  01/12/2009  FINDINGS: The brainstem, cerebellum, cerebral peduncles, thalamus, basal ganglia, basilar cisterns, and ventricular system appear within normal limits. No intracranial hemorrhage, mass lesion, or acute CVA. Visualized paranasal sinuses appear clear.  IMPRESSION: 1.  No significant abnormality identified.   Electronically Signed   By: Herbie Baltimore M.D.   On: 07/26/2014 20:03   Mr Lumbar Spine Wo Contrast  07/26/2014  CLINICAL DATA:  Side headache syndrome. Radiculopathy of the left leg. Falls. Low back pain. Pain extending into the left lower extremity following  lumbar spine surgery. Numbness and burning to the toes.  EXAM: MRI LUMBAR SPINE WITHOUT CONTRAST  TECHNIQUE: Multiplanar, multisequence MR imaging of the lumbar spine was performed. No intravenous contrast was administered.  COMPARISON:  MRI lumbar spine 01/26/2009  FINDINGS: Normal signal is present in the conus medullaris which terminates at T12-L1, within normal limits. Marrow signal, vertebral body heights, and alignment are normal. Focal T2 higher the scratch the focal T2 hyperintensity anterior to the L2 vertebral body is stable, compatible with a benign and prominent cisterna chyli, a normal variant.  Limited imaging of the abdomen is otherwise unremarkable. There is no significant adenopathy.  The disc levels at all L3-4 and above are normal.  L4-5: A mild broad-based disc protrusion is similar to the prior exam. Mild subarticular and foraminal narrowing is minimally worse. Moderate facet hypertrophy is evident.  L5-S1: Anterior fusion is noted. A residual or recurrent rightward disc protrusion results in mild subarticular narrowing bilaterally, worse on the right. The left foramen is widely patent. Minimal right foraminal narrowing is due to progressive facet hypertrophy.  IMPRESSION: 1. Slight worsening of mild subarticular and foraminal narrowing bilaterally at L4-5. 2. Residual or recurrent rightward disc protrusion at L5-S1 following anterior fusion. 3. Mild subarticular narrowing at L5-S1 is worse on the right. 4. Minimal right foraminal narrowing due to progressive facet hypertrophy at L5-S1.   Electronically Signed   By: Gennette Pac M.D.   On: 07/26/2014 19:59   Mr Cervical Spine W Wo Contrast  07/27/2014   CLINICAL DATA:  Degenerative disc disease of cervical spine. Left-sided numbness and weakness. Unsteady gait with falls. Personal history of cervical spine fusion.  EXAM: MRI CERVICAL SPINE WITHOUT AND WITH CONTRAST  TECHNIQUE: Multiplanar and multiecho pulse sequences of the cervical spine,  to include the craniocervical junction and cervicothoracic junction, were obtained according to standard protocol without and with intravenous contrast.  CONTRAST:  10mL MULTIHANCE GADOBENATE DIMEGLUMINE 529 MG/ML IV SOLN  COMPARISON:  MRI of the cervical spine 01/12/2009  FINDINGS: Normal signal is present in the cervical and upper thoracic spinal cord to the lowest imaged level, T1-2. Marrow signal and vertebral body heights are otherwise normal.  Craniocervical junction is within normal limits. The visualized intracranial contents are normal. Flow is present in the vertebral arteries bilaterally.  C2-3:  Negative.  C3-4: A rightward disc osteophyte complex partially effaces ventral CSF. Mild right foraminal narrowing is evident.  C4-5: Patient is fused anteriorly. Asymmetric right-sided uncovertebral spurring contributes to mild right foraminal stenosis. The central canal is patent.  C5-6: A broad-based disc osteophyte complex partially effaces the ventral CSF. The central canal and foramina are patent.  C6-7: Very shallow central disc protrusion is present without significant stenosis.  C7-T1:  Negative.  The postcontrast images demonstrate no pathologic enhancement.  IMPRESSION: 1. Anterior fusion at C4-5 with mild residual right foraminal narrowing due to uncovertebral spurring. 2. Mild right foraminal narrowing at C3-4 due to a rightward disc osteophyte complex and uncovertebral spurring. 3. Broad-based disc osteophyte complex with partial effacement of the ventral CSF at C5-6 but no significant stenosis. 4. Very shallow central disc protrusion at C6-7 without significant stenosis.   Electronically Signed   By: Gennette Pac M.D.   On: 07/27/2014 10:26   Objective  Filed Vitals:   07/28/14 0100 07/28/14 0445 07/28/14 0939 07/28/14 1331  BP:  104/50 94/58 90/55  90/63  Pulse: 80 78 73 96  Temp: 98 F (36.7 C) 98.3 F (36.8 C) 97.9 F (36.6 C) 98.9 F (37.2 C)  TempSrc: Oral Oral Oral Oral  Resp:  18 16 18 18   Height:      Weight:      SpO2: 97% 95% 97% 99%    Intake/Output Summary (Last 24 hours) at 07/28/14 1352 Last data filed at 07/27/14 2217  Gross per 24 hour  Intake      3 ml  Output      0 ml  Net      3 ml   Filed Weights   07/26/14 1556  Weight: 68.402 kg (150 lb 12.8 oz)   Exam:  General:  NAD  HEENT: no scleral icterus  Cardiovascular: RRR  Respiratory: CTA biL  Abdomen: soft, non tender  Skin: no rashes  Neuro: non focal  Data Reviewed: Basic Metabolic Panel:  Recent Labs Lab 07/26/14 1840 07/28/14 0538  NA 141 137  K 3.1* 4.2  CL 107 106  CO2 28 21  GLUCOSE 107* 91  BUN <5* 7  CREATININE 0.86 0.86  CALCIUM 8.9 9.1  MG 1.9  --    CBC:  Recent Labs Lab 07/27/14 0555  WBC 4.8  HGB 11.5*  HCT 33.4*  MCV 85.2  PLT 215    Scheduled Meds: . ALPRAZolam  1 mg Oral TID  . dextromethorphan-guaiFENesin  1 tablet Oral BID  . docusate sodium  100 mg Oral BID  . enoxaparin (LOVENOX) injection  40 mg Subcutaneous Q24H  . fluticasone  2 spray Each Nare Daily  . levothyroxine  88 mcg Oral QAC breakfast  . loratadine  10 mg Oral Daily  . morphine  15 mg Oral Q12H  . multivitamin with minerals  1 tablet Oral Daily  . QUEtiapine  300 mg Oral QHS  . sodium chloride  3 mL Intravenous Q12H  . temazepam  30 mg Oral QHS   Continuous Infusions:   Pamella Pertostin Gherghe, MD [facility] Pager [phone]. If 7 PM - 7 AM, please contact night-coverage at [email or web address], password [email or web address] 07/28/2014, 1:52 PM  LOS: 2 days

## 2014-07-28 NOTE — Progress Notes (Signed)
Physical Therapy Treatment Patient Details Name: [name] MRN: [id number] DOB: Sep 21, 1970 Today's Date: 07/28/2014    History of Present Illness Patient is a 44 y/o female with PMH of  h/o seizures, migraines, chronic pain, bipolar disorder, anxiety,  fibromyalgia, DDD s/p c spine and lumbar surgery in 2012, psoriatic arthritis, chronic urticaria, recently diagnosed hypothroidism directly admitted from Dr. Diamantina [place] office with multiple complaints- legs giving out, falls, radiating pain in LLE, frequent headaches and syncopal episode about a week ago. Pt admitted with episode of LOC with post event dysarthria. MR lumbar spine-Slight worsening of mild subarticular and foraminal narrowing B L4-5, recurrent rightward disc protrusion at L5-S1, Mild subarticular narrowing at L5-S1 is worse on the right.     PT Comments    Pt. Very compliant and very interested in suggestions to explain problem and lessen pain. Ambulated well with one rest break to wait on pain to lessen. Pt. Would benefit from strengthening and balance training before being d/c'd.  Follow Up Recommendations  Outpatient PT;Supervision - Intermittent     Equipment Recommendations  None recommended by PT    Recommendations for Other Services OT consult     Precautions / Restrictions Precautions Precautions: Fall Precaution Comments: Pt reports multiple falls at home daily with LLE giving out- unpredictable. Restrictions Weight Bearing Restrictions: No    Mobility  Bed Mobility Overal bed mobility: Modified Independent Bed Mobility: Supine to Sit;Sit to Supine     Supine to sit: HOB elevated;Modified independent (Device/Increase time) Sit to supine: HOB elevated;Modified independent (Device/Increase time)      Transfers Overall transfer level: Modified independent Equipment used: None Transfers: Sit to/from Stand Sit to Stand: Min guard         General transfer comment: Min guard due to unpredictability  of LLE.  Ambulation/Gait Ambulation/Gait assistance: Min guard Ambulation Distance (Feet): 100 Feet Assistive device: Rolling walker (2 wheeled) Gait Pattern/deviations: Step-through pattern Gait velocity: slow Gait velocity interpretation: Below normal speed for age/gender General Gait Details: improved gait with walker with no LOB.   Stairs            Wheelchair Mobility    Modified Rankin (Stroke Patients Only)       Balance Overall balance assessment: Needs assistance Sitting-balance support: Feet supported;No upper extremity supported Sitting balance-Leahy Scale: Good Sitting balance - Comments: Able to reach outside BoS and donn shoes without difficulty.    Standing balance support: During functional activity Standing balance-Leahy Scale: Fair   Single Leg Stance - Right Leg: 10 Single Leg Stance - Left Leg: 10 Tandem Stance - Right Leg: 10 Tandem Stance - Left Leg: 10            Cognition Arousal/Alertness: Awake/alert Behavior During Therapy: WFL for tasks assessed/performed Overall Cognitive Status: Within Functional Limits for tasks assessed                      Exercises      General Comments General comments (skin integrity, edema, etc.): Education provided for follow up therapy suggesting she try OP and the lumbar traction machine. Pt. has tried PT, acupuncture, chiropractors and botox injections into spine and head prior to surgery. Provided education on back stretches x 4      Pertinent Vitals/Pain Pain Assessment: 0-10 Pain Score: 2  Pain Location: Low back/radiating down LLE Pain Descriptors / Indicators: Tiring Pain Intervention(s): Monitored during session    Home Living  Prior Function            PT Goals (current goals can now be found in the care plan section) Progress towards PT goals: Progressing toward goals    Frequency  Min 3X/week    PT Plan Current plan remains appropriate     Co-evaluation             End of Session Equipment Utilized During Treatment: Gait belt Activity Tolerance: Patient tolerated treatment well Patient left: in bed;with call bell/phone within reach;with bed alarm set     Time: 1436-1510 PT Time Calculation (min) (ACUTE ONLY): 34 min  Charges:                       G Codes:      [name], [name], SPTA 07/28/2014, 3:31 PM

## 2014-07-29 ENCOUNTER — Observation Stay (HOSPITAL_COMMUNITY): Payer: Medicare Other

## 2014-07-29 DIAGNOSIS — R55 Syncope and collapse: Secondary | ICD-10-CM | POA: Diagnosis not present

## 2014-07-29 MED ORDER — LEVOTHYROXINE SODIUM 88 MCG PO TABS: 88.0000 ug | ORAL_TABLET | Freq: Every day | ORAL | Status: DC

## 2014-07-29 MED ORDER — CYCLOBENZAPRINE HCL 5 MG PO TABS: 5.0000 mg | ORAL_TABLET | Freq: Three times a day (TID) | ORAL | Status: DC | PRN

## 2014-07-29 MED ORDER — GADOBENATE DIMEGLUMINE 529 MG/ML IV SOLN
20.0000 mL | Freq: Once | INTRAVENOUS | Status: AC
  Administered 2014-07-29: 20 mL via INTRAVENOUS

## 2014-07-29 MED ORDER — CYCLOBENZAPRINE HCL 10 MG PO TABS
10.0000 mg | ORAL_TABLET | Freq: Three times a day (TID) | ORAL | Status: DC | PRN
  Administered 2014-07-29 – 2014-07-30 (×2): 10 mg via ORAL
  Filled 2014-07-29 (×2): qty 1

## 2014-07-29 NOTE — Progress Notes (Deleted)
Patient can't be discharged today due to weather conditions, as her ride can't make today due to snow, will hold on discharge till am. Huey B[place] Cheick Suhr MD

## 2014-07-29 NOTE — Discharge Summary (Addendum)
[address] 44 y.o., DOB Jan 27, 1971, MRN [id number]. Admission date: 07/26/2014 Discharge Date 07/29/2014 Primary MD Willey Blade, MD Admitting Physician Calvert Cantor, MD  Admission Diagnosis  herniated disc, low back pain, left leg pain and weakness  Discharge Diagnosis   Principal Problem:   Syncope Active Problems:   Falls   Low back pain   Headache   History of seizures   Hypothyroidism   Hypokalemia   Bipolar disorder   Generalized anxiety disorder   Chronic urticaria   Asthma, chronic     PCP please folllow on: - Patient will need outpatient follow-up with [facility] for nerve conduction study.  Past Medical History  Diagnosis Date  . Migraine   . Seizure   . DDD (degenerative disc disease), cervical   . DDD (degenerative disc disease), lumbar   . Psoriatic arthritis   . Chronic urticaria   . Bipolar disorder   . Fibromyalgia   . Raynaud's disease   . Panic attack   . Kidney stones   . Asthma   . Hypothyroidism     Past Surgical History  Procedure Laterality Date  . Cervical spine surgery    . Lumbar spine surgery    . Partial hysterectomy      bleeding  . Tubal ligation    . Cholecystecomy    . Rhinoplasty    . Tonsillectomy and adenoidectomy    . Breast enhancement surgery    . Abdominal hysterectomy       Hospital Course See H&P, Labs, Consult and Test reports for all details in brief, patient was admitted for **  Principal Problem:   Syncope Active Problems:   Falls   Low back pain   Headache   History of seizures   Hypothyroidism   Hypokalemia   Bipolar disorder   Generalized anxiety disorder   Chronic urticaria   Asthma, chronic  HPI: With h/o seizures, migraines, chronic pain, bipolar disorder, anxiety, fibromyalgia, DDD s/p c spine and lumbar surgery in 2012, psoriatic arthritis, chronic urticaria, recently diagnosed hypothroidism directly admitted from Dr. Diamantina [facility] office with multiple complaints. Frequent falls, legs  giving out, low back pain with radiation to both legs, frequent headaches, syncopal episode about a week ago, which she says was a "seizure". Syncopal episode was unwitnessed, but patient reportedly lost consciousness. Spoke to her mother by phone afterwards. Mother reports speech was slow, and slurred. Lives in [name], but still sees Dr. August Saucer as PCP. Admitted for MRI L spine and workup of syncope. Denies CP, palpitations, dyspnea. Has had about once yearly seizures since childhood, no longer on AEDs. Does not see neurologist or rheumatologist. Only new medication is synthroid, about 2 months ago. Denies drinking or drug use. Also was told "borderline diabetes" and hyperlipidemia recently. Broke out in hives with statin, so she stopped. On multiple sedating meds: Seroquel, xanax, restoril, MS Contin, and wishes she could wean off many. Denies having run out of any.  Syncope:  - EEG unremarkable - CT of the brain unremarkable  - MRI brain showing no acute finding - no significant events on telemetry - EKG is nonacute  Falls/Low back pain with radiculopathy/DDD:  - Patient did get an MRI of the L-spine and C spine, CT findings below,DR Gherghe  discussed with Dr. Lovell Sheehan from neurosurgery this findings, he does not see any need for any acute surgical intervention she can follow-up with him as an outpatient if she chooses to do so. Dr Elvera Lennox have also discussed with neurology  over the phone and they recommended a nerve conduction studies which also can be arranged as an outpatient, I will make the referral and give the patient information about outpatient neurology. - B12 level was normal at 768.  - complains of headaches, facial numbness without apparent territory,  Hypothyroidism - TSH is slightly low, decreased levothyroxine dose from 100 MCG to 88 MCG Headaches History of seizures Hypokalemia: may be contributing to symptoms. Replete and check magnesium Bipolar  disorder Generalized anxiety disorder Chronic urticaria Asthma, chronic: no wheeze Consults None  Significant Tests:  See full reports for all details    Ct Head Wo Contrast  07/26/2014   CLINICAL DATA:  Seizure  EXAM: CT HEAD WITHOUT CONTRAST  TECHNIQUE: Contiguous axial images were obtained from the base of the skull through the vertex without intravenous contrast.  COMPARISON:  01/12/2009  FINDINGS: The brainstem, cerebellum, cerebral peduncles, thalamus, basal ganglia, basilar cisterns, and ventricular system appear within normal limits. No intracranial hemorrhage, mass lesion, or acute CVA. Visualized paranasal sinuses appear clear.  IMPRESSION: 1.  No significant abnormality identified.   Electronically Signed   By: Herbie Baltimore M.D.   On: 07/26/2014 20:03   Mr [name] ZO Contrast  07/29/2014   CLINICAL DATA:  Frequent falls, imbalance. Legs giving out. History of seizures, migraines, bipolar disorder, anxiety, and fibromyalgia. Possible syncopal episode.  EXAM: MRI HEAD WITHOUT AND WITH CONTRAST  TECHNIQUE: Multiplanar, multiecho pulse sequences of the brain and surrounding structures were obtained without and with intravenous contrast.  CONTRAST:  20mL MULTIHANCE GADOBENATE DIMEGLUMINE 529 MG/ML IV SOLN  COMPARISON:  MRI cervical spine 07/27/2014.  CT head 07/26/2014.  FINDINGS: No evidence for acute infarction, hemorrhage, mass lesion, hydrocephalus, or extra-axial fluid. Mild premature cerebral atrophy. No significant white matter disease. Flow voids are maintained throughout the carotid, basilar, and vertebral arteries. There are no areas of chronic hemorrhage. Pituitary, pineal, and cerebellar tonsils unremarkable. No upper cervical lesions.  Post infusion, no abnormal enhancement of the brain or meninges. Visualized calvarium, skull base, and upper cervical osseous structures unremarkable. Scalp and extracranial soft tissues, orbits, sinuses, and mastoids show no acute process.   IMPRESSION: Mild premature cerebral atrophy.  No acute intracranial findings.   Electronically Signed   By: Davonna Belling M.D.   On: 07/29/2014 08:56   Mr Lumbar Spine Wo Contrast  07/26/2014   CLINICAL DATA:  Side headache syndrome. Radiculopathy of the left leg. Falls. Low back pain. Pain extending into the left lower extremity following lumbar spine surgery. Numbness and burning to the toes.  EXAM: MRI LUMBAR SPINE WITHOUT CONTRAST  TECHNIQUE: Multiplanar, multisequence MR imaging of the lumbar spine was performed. No intravenous contrast was administered.  COMPARISON:  MRI lumbar spine 01/26/2009  FINDINGS: Normal signal is present in the conus medullaris which terminates at T12-L1, within normal limits. Marrow signal, vertebral body heights, and alignment are normal. Focal T2 higher the scratch the focal T2 hyperintensity anterior to the L2 vertebral body is stable, compatible with a benign and prominent cisterna chyli, a normal variant.  Limited imaging of the abdomen is otherwise unremarkable. There is no significant adenopathy.  The disc levels at all L3-4 and above are normal.  L4-5: A mild broad-based disc protrusion is similar to the prior exam. Mild subarticular and foraminal narrowing is minimally worse. Moderate facet hypertrophy is evident.  L5-S1: Anterior fusion is noted. A residual or recurrent rightward disc protrusion results in mild subarticular narrowing bilaterally, worse on the right. The left  foramen is widely patent. Minimal right foraminal narrowing is due to progressive facet hypertrophy.  IMPRESSION: 1. Slight worsening of mild subarticular and foraminal narrowing bilaterally at L4-5. 2. Residual or recurrent rightward disc protrusion at L5-S1 following anterior fusion. 3. Mild subarticular narrowing at L5-S1 is worse on the right. 4. Minimal right foraminal narrowing due to progressive facet hypertrophy at L5-S1.   Electronically Signed   By: Gennette Pac M.D.   On: 07/26/2014 19:59    Mr Cervical Spine W Wo Contrast  07/27/2014   CLINICAL DATA:  Degenerative disc disease of cervical spine. Left-sided numbness and weakness. Unsteady gait with falls. Personal history of cervical spine fusion.  EXAM: MRI CERVICAL SPINE WITHOUT AND WITH CONTRAST  TECHNIQUE: Multiplanar and multiecho pulse sequences of the cervical spine, to include the craniocervical junction and cervicothoracic junction, were obtained according to standard protocol without and with intravenous contrast.  CONTRAST:  10mL MULTIHANCE GADOBENATE DIMEGLUMINE 529 MG/ML IV SOLN  COMPARISON:  MRI of the cervical spine 01/12/2009  FINDINGS: Normal signal is present in the cervical and upper thoracic spinal cord to the lowest imaged level, T1-2. Marrow signal and vertebral body heights are otherwise normal.  Craniocervical junction is within normal limits. The visualized intracranial contents are normal. Flow is present in the vertebral arteries bilaterally.  C2-3:  Negative.  C3-4: A rightward disc osteophyte complex partially effaces ventral CSF. Mild right foraminal narrowing is evident.  C4-5: Patient is fused anteriorly. Asymmetric right-sided uncovertebral spurring contributes to mild right foraminal stenosis. The central canal is patent.  C5-6: A broad-based disc osteophyte complex partially effaces the ventral CSF. The central canal and foramina are patent.  C6-7: Very shallow central disc protrusion is present without significant stenosis.  C7-T1:  Negative.  The postcontrast images demonstrate no pathologic enhancement.  IMPRESSION: 1. Anterior fusion at C4-5 with mild residual right foraminal narrowing due to uncovertebral spurring. 2. Mild right foraminal narrowing at C3-4 due to a rightward disc osteophyte complex and uncovertebral spurring. 3. Broad-based disc osteophyte complex with partial effacement of the ventral CSF at C5-6 but no significant stenosis. 4. Very shallow central disc protrusion at C6-7 without significant  stenosis.   Electronically Signed   By: Gennette Pac M.D.   On: 07/27/2014 10:26     Today   Subjective:   [name] today has no headache,no chest abdominal pain,no new weakness tingling or numbness, feels much better today.  Objective:   Blood pressure 93/60, pulse 88, temperature 98.1 F (36.7 C), temperature source Oral, resp. rate 18, height 5' 4.5" (1.638 m), weight 68.402 kg (150 lb 12.8 oz), SpO2 100 %.  Intake/Output Summary (Last 24 hours) at 07/29/14 1058 Last data filed at 07/28/14 1743  Gross per 24 hour  Intake    480 ml  Output      0 ml  Net    480 ml    Exam Awake Alert, Oriented *3, No new F.N deficits, Normal affect .AT,PERRAL Supple Neck,No JVD, No cervical lymphadenopathy appriciated.  Symmetrical Chest wall movement, Good air movement bilaterally, CTAB RRR,No Gallops,Rubs or new Murmurs, No Parasternal Heave +ve B.Sounds, Abd Soft, Non tender, No organomegaly appriciated, No rebound -guarding or rigidity. No Cyanosis, Clubbing or edema, No new Rash or bruise  Data Review   Cultures -   CBC w Diff:  Lab Results  Component Value Date   WBC 4.8 07/27/2014   HGB 11.5* 07/27/2014   HCT 33.4* 07/27/2014   HCT 34.1 07/26/2014  PLT 215 07/27/2014   LYMPHOPCT 23 01/11/2009   MONOPCT 9 01/11/2009   EOSPCT 3 01/11/2009   BASOPCT 0 01/11/2009   CMP:  Lab Results  Component Value Date   NA 137 07/28/2014   K 4.2 07/28/2014   CL 106 07/28/2014   CO2 21 07/28/2014   BUN 7 07/28/2014   CREATININE 0.86 07/28/2014   PROT 5.7* 01/28/2009   ALBUMIN 3.2* 01/28/2009   BILITOT 0.3 01/28/2009   ALKPHOS 56 01/28/2009   AST 14 01/28/2009   ALT 16 01/28/2009  .  Micro Results No results found for this or any previous visit (from the past 240 hour(s)).   Discharge Instructions          Follow-up Information    Follow up with DEAN, ERIC, MD. Schedule an appointment as soon as possible for a visit in 1 week.   Specialty:  Internal  Medicine   Contact information:   [facility] [address] [place] [address] [phone]       Follow up with [facility]. Schedule an appointment as soon as possible for a visit in 1 week.   Why:  PLEASE CALL TO MAKE APPOINTMENT FOR NERVE CONDUCTION STUDY.   Contact information:   [address]     [address] [place] [address] [phone]      Discharge Medications     Medication List    TAKE these medications        albuterol 108 (90 BASE) MCG/ACT inhaler  Commonly known as:  PROVENTIL HFA;VENTOLIN HFA  Inhale 1-2 puffs into the lungs every 4 (four) hours as needed for wheezing or shortness of breath.     ALPRAZolam 1 MG tablet  Commonly known as:  XANAX  Take 1 mg by mouth 3 (three) times daily.     bisacodyl 5 MG EC tablet  Commonly known as:  DULCOLAX  Take 15 mg by mouth daily as needed (constipation).     Calcium-Vitamin D-Vitamin K 9022887851-40 MG-UNT-MCG Chew  Chew 1 tablet by mouth daily.     cetirizine 10 MG tablet  Commonly known as:  ZYRTEC  Take 10 mg by mouth at bedtime.     cyclobenzaprine 5 MG tablet  Commonly known as:  FLEXERIL  Take 1 tablet (5 mg total) by mouth 3 (three) times daily as needed for muscle spasms.     EPINEPHrine 0.3 mg/0.3 mL Soaj injection  Commonly known as:  EPI-PEN  Inject 0.3 mg into the muscle as needed (allergic reaction).     levothyroxine 88 MCG tablet  Commonly known as:  SYNTHROID, LEVOTHROID  Take 1 tablet (88 mcg total) by mouth daily before breakfast.     loratadine 10 MG tablet  Commonly known as:  CLARITIN  Take 10 mg by mouth at bedtime.     mometasone 50 MCG/ACT nasal spray  Commonly known as:  NASONEX  Place 2 sprays into both nostrils at bedtime.     morphine 15 MG 12 hr tablet  Commonly known as:  MS CONTIN  Take 15 mg by mouth every 12 (twelve) hours.     MUCINEX DM 30-600 MG Tb12  Take 1 tablet by mouth 2 (two) times  daily.     multivitamin with minerals Tabs tablet  Take 1 tablet by mouth daily.     PRESCRIPTION MEDICATION  Apply 1 application topically daily as needed (psoriasis).     QUEtiapine 300 MG tablet  Commonly known  as:  SEROQUEL  Take 450 mg by mouth at bedtime.     temazepam 15 MG capsule  Commonly known as:  RESTORIL  Take 15-45 mg by mouth at bedtime. Dose varies depending on stage of restlessness         Total Time in preparing paper work, data evaluation and todays exam - 35 minutes  [name] M.D on 07/29/2014 at 10:58 AM  [facility] Office  [phone]

## 2014-07-29 NOTE — Progress Notes (Signed)
Patient has refused to discharge today due to inclement weather and not having transportation to take her home.  Patient was aware of discharge since 11am, and still continued to stall her discharged.  She has refused transportation via [facility].  Patient will stay overnight and her fiance will pick her up in the AM

## 2014-07-29 NOTE — Progress Notes (Signed)
Spoke with pt re: transportation arrangements home this pm.  Ambulance transport offered based on pt's PMH/admitting diagnoses.  Pt refuses to leave tonight, as her mother cannot get here to pick her up due to weather.  Pt states that her finacee, Annette StableBill will be travelling from [place] in the am to get her and take her home.  Unit CSW to f/u in am re: pt's transportation arrangements.

## 2014-07-29 NOTE — Progress Notes (Signed)
Discharge instructions reviewed with patient. Prescriptions given to patient.

## 2014-07-29 NOTE — Care Management Note (Addendum)
  Page 2 of 2   07/29/2014     2:05:22 PM CARE MANAGEMENT NOTE 07/29/2014  Patient:  [name],[name]   Account Number:  [id number]  Date Initiated:  07/29/2014  Documentation initiated by:  Elmer BalesOBARGE,Violia Knopf  Subjective/Objective Assessment:   Patient was admitted with herniated disc, low back pain, left leg pain, weakness.     Action/Plan:   will follow for discharge needs.   Anticipated DC Date:  07/29/2014   Anticipated DC Plan:  HOME W HOME HEALTH SERVICES      DC Planning Services  CM consult      Choice offered to / List presented to:  C-1 Patient        HH arranged  HH-2 PT      [facility]   Status of service:   Medicare Important Message given? NO (If response is "NO", the following Medicare IM given date fields will be blank) Date Medicare IM given:   Medicare IM given by:   Date Additional Medicare IM given:   Additional Medicare IM given by:    Discharge Disposition:  HOME W HOME HEALTH SERVICES  Per UR Regulation:  Reviewed for med. necessity/level of care/duration of stay  If discussed at Long Length of Stay Meetings, dates discussed:    Comments:    07/29/14 1330 Elmer Balesourtney Rozalia Dino RN, MSN, CM- Spoke at length with patient to discuss discharge planning. Patient states that she is unable to participate in outpatient PT due to her waxing and waning regarding her ability to ambulate. She states [place] is often unable to drive [place]  This was discussed with MD and home health PT was ordered. [facility] and information was faxed to [place] at [phone].  Services are scheduled to begin tomorrow.  Patient's updated address:  [address] [place], [place] [address]

## 2014-07-29 NOTE — Discharge Instructions (Signed)
Follow with Primary MD August SaucerEAN, ERIC, MD in 7 days   Get CBC, CMP, 2 view Chest X ray checked  by Primary MD next visit.    Activity: As tolerated with Full fall precautions use walker/cane & assistance as needed   Disposition Home    Diet: Heart Healthy , with feeding assistance and aspiration precautions as needed.     On your next visit with your primary care physician please Get Medicines reviewed and adjusted.   Please request your Prim.MD to go over all Hospital Tests and Procedure/Radiological results at the follow up, please get all Hospital records sent to your Prim MD by signing hospital release before you go home.   If you experience worsening of your admission symptoms, develop shortness of breath, life threatening emergency, suicidal or homicidal thoughts you must seek medical attention immediately by calling 911 or calling your MD immediately  if symptoms less severe.  You Must read complete instructions/literature along with all the possible adverse reactions/side effects for all the Medicines you take and that have been prescribed to you. Take any new Medicines after you have completely understood and accpet all the possible adverse reactions/side effects.   Do not drive, operating heavy machinery, perform activities at heights, swimming or participation in water activities or provide baby sitting services if your were admitted for syncope or siezures until you have seen by Primary MD or a Neurologist and advised to do so again.  Do not drive when taking Pain medications.    Do not take more than prescribed Pain, Sleep and Anxiety Medications  Special Instructions: If you have smoked or chewed Tobacco  in the last 2 yrs please stop smoking, stop any regular Alcohol  and or any Recreational drug use.  Wear Seat belts while driving.   Please note  You were cared for by a hospitalist during your hospital stay. If you have any questions about your discharge  medications or the care you received while you were in the hospital after you are discharged, you can call the unit and asked to speak with the hospitalist on call if the hospitalist that took care of you is not available. Once you are discharged, your primary care physician will handle any further medical issues. Please note that NO REFILLS for any discharge medications will be authorized once you are discharged, as it is imperative that you return to your primary care physician (or establish a relationship with a primary care physician if you do not have one) for your aftercare needs so that they can reassess your need for medications and monitor your lab values.

## 2014-07-29 NOTE — Progress Notes (Signed)
Physical Therapy Treatment Patient Details Name: [name] MRN: [id number] DOB: 1970-08-03 Today's Date: 07/29/2014    History of Present Illness Patient is a 44 y/o female with PMH of  h/o seizures, migraines, chronic pain, bipolar disorder, anxiety,  fibromyalgia, DDD s/p c spine and lumbar surgery in 2012, psoriatic arthritis, chronic urticaria, recently diagnosed hypothroidism directly admitted from Dr. Diamantina [facility] office with multiple complaints- legs giving out, falls, radiating pain in LLE, frequent headaches and syncopal episode about a week ago. Pt admitted with episode of LOC with post event dysarthria. MR lumbar spine-Slight worsening of mild subarticular and foraminal narrowing B L4-5, recurrent rightward disc protrusion at L5-S1, Mild subarticular narrowing at L5-S1 is worse on the right.     PT Comments    Pt. Wanted [place] to wait until pain medication worked the first time we tried working with her, so we came back to her and she agreed to go ambulate and review stretches. Pt. Has requested to be d/c'd to [facility] PT because of the fear of falling and decreased mobility.   Follow Up Recommendations  Outpatient PT;Supervision - Intermittent     Equipment Recommendations  None recommended by PT    Recommendations for Other Services OT consult     Precautions / Restrictions Precautions Precautions: Fall Precaution Comments: Pt reports multiple falls at home daily with LLE giving out- unpredictable. Restrictions Weight Bearing Restrictions: No    Mobility  Bed Mobility               General bed mobility comments: sitting on EOB bed on arrival  Transfers Overall transfer level: Modified independent Equipment used: None Transfers: Sit to/from Stand Sit to Stand: Supervision         General transfer comment: supervision for safety  Ambulation/Gait Ambulation/Gait assistance: Min guard Ambulation Distance (Feet): 300 Feet Assistive device: Rolling  walker (2 wheeled);None Gait Pattern/deviations: Step-through pattern Gait velocity: slow Gait velocity interpretation: Below normal speed for age/gender General Gait Details: walked half with walker and then half without and with no AD there was no leg buckling; still slow and controlled.   Stairs            Wheelchair Mobility    Modified Rankin (Stroke Patients Only)       Balance Overall balance assessment: No apparent balance deficits (not formally assessed)   Sitting balance-Leahy Scale: Good Sitting balance - Comments: Able to reach outside BoS and donn shoes without difficulty.    Standing balance support: During functional activity                        Cognition Arousal/Alertness: Awake/alert Behavior During Therapy: WFL for tasks assessed/performed Overall Cognitive Status: Within Functional Limits for tasks assessed                      Exercises Other Exercises Other Exercises: Cat and camel stretch 2 x30" Other Exercises: LE rotation 30" Other Exercises: Figure 4 stretch both leg 30" each    General Comments        Pertinent Vitals/Pain Pain Score: 4  Pain Location: low back/radiating down leg Pain Descriptors / Indicators: Shooting;Sore Pain Intervention(s): Monitored during session;Premedicated before session    Home Living                      Prior Function            PT Goals (current goals can now  be found in the care plan section) Progress towards PT goals: Progressing toward goals    Frequency  Min 3X/week    PT Plan Current plan remains appropriate    Co-evaluation             End of Session Equipment Utilized During Treatment: Gait belt Activity Tolerance: Patient tolerated treatment well Patient left: in bed;with call bell/phone within reach;with bed alarm set     Time: 1415-1432 PT Time Calculation (min) (ACUTE ONLY): 17 min  Charges:  $Gait Training: 8-22 mins                     G Codes:      [name], [name], SPTA 07/29/2014, 3:13 PM

## 2014-07-29 NOTE — Progress Notes (Signed)
Patient refuses to call staff prior to ambulating. Patient turns off bed alarm herself. Patient educated regarding hospital policies

## 2014-07-30 DIAGNOSIS — R55 Syncope and collapse: Secondary | ICD-10-CM | POA: Diagnosis not present

## 2014-07-30 NOTE — Progress Notes (Signed)
PROGRESS NOTE  [name] ZOX:[id number] DOB: 15-Apr-1971 DOA: 07/26/2014 PCP: Willey Blade, MD    Subjective / 24 H Interval events - feels at baseline this morning, no new complaints, thickened events overnight - patient discharge yesterday was cancelled as her mother could not pick her up yesterday due to weather conditions.  Assessment/Plan: Principal Problem:   Syncope Active Problems:   Falls   Low back pain   Headache   History of seizures   Hypothyroidism   Hypokalemia   Bipolar disorder   Generalized anxiety disorder   Chronic urticaria   Asthma, chronic   Syncope:  - EEG unremarkable - CT of the brain unremarkable  - MRI brain showing no acute finding - no significant events on telemetry - EKG is nonacute  Falls/Low back pain with radiculopathy/DDD: - Patient did get an MRI of the L-spine and C spine, CT findings below,DR Gherghe discussed with Dr. Lovell Sheehan from neurosurgery this findings, he does not see any need for any acute surgical intervention she can follow-up with him as an outpatient if she chooses to do so. Dr Elvera Lennox have also discussed with neurology over the phone and they recommended a nerve conduction studies which also can be arranged as an outpatient, I will make the referral and give the patient information about outpatient neurology. - B12 level was normal at 768.  - complains of headaches, facial numbness without apparent territory,  Hypothyroidism - TSH is slightly low, decreased levothyroxine dose from 100 MCG to 88 MCG Headaches History of seizures Hypokalemia: may be contributing to symptoms. Replete and check magnesium Bipolar disorder Generalized anxiety disorder Chronic urticaria Asthma, chronic: no wheeze Consults None   Diet: Diet regular Diet - low sodium heart healthy Fluids: none DVT Prophylaxis: Lovenox  Code Status: Full Code Family Communication: none  Disposition Plan: remain  inpatient  Consultants:  Neurosurgery (phone)  Neurology (phone)  Procedures:  None    Antibiotics None    Studies  Mr [name] Wo Contrast  08/05/14   CLINICAL DATA:  Frequent falls, imbalance. Legs giving out. History of seizures, migraines, bipolar disorder, anxiety, and fibromyalgia. Possible syncopal episode.  EXAM: MRI HEAD WITHOUT AND WITH CONTRAST  TECHNIQUE: Multiplanar, multiecho pulse sequences of the brain and surrounding structures were obtained without and with intravenous contrast.  CONTRAST:  20mL MULTIHANCE GADOBENATE DIMEGLUMINE 529 MG/ML IV SOLN  COMPARISON:  MRI cervical spine 07/27/2014.  CT head 07/26/2014.  FINDINGS: No evidence for acute infarction, hemorrhage, mass lesion, hydrocephalus, or extra-axial fluid. Mild premature cerebral atrophy. No significant white matter disease. Flow voids are maintained throughout the carotid, basilar, and vertebral arteries. There are no areas of chronic hemorrhage. Pituitary, pineal, and cerebellar tonsils unremarkable. No upper cervical lesions.  Post infusion, no abnormal enhancement of the brain or meninges. Visualized calvarium, skull base, and upper cervical osseous structures unremarkable. Scalp and extracranial soft tissues, orbits, sinuses, and mastoids show no acute process.  IMPRESSION: Mild premature cerebral atrophy.  No acute intracranial findings.   Electronically Signed   By: Davonna Belling M.D.   On: Aug 05, 2014 08:56   Objective  Filed Vitals:   08/05/14 1036 08/05/2014 1412 07/30/14 0230 07/30/14 0700  BP: 93/60 110/68 102/63 97/56  Pulse: 88 82 102 95  Temp: 98.1 F (36.7 C) 98.6 F (37 C) 97.8 F (36.6 C) 98.1 F (36.7 C)  TempSrc: Oral Oral Oral Oral  Resp: Height:      Weight:  SpO2: 100% 100% 99% 96%   No intake or output data in the 24 hours ending 07/30/14 0934 Filed Weights   07/26/14 1556  Weight: 68.402 kg (150 lb 12.8 oz)   Exam:  General:  NAD  HEENT: no scleral  icterus  Cardiovascular: RRR  Respiratory: CTA biL  Abdomen: soft, non tender  Skin: no rashes  Neuro: non focal  Data Reviewed: Basic Metabolic Panel:  Recent Labs Lab 07/26/14 1840 07/28/14 0538  NA 141 137  K 3.1* 4.2  CL 107 106  CO2 28 21  GLUCOSE 107* 91  BUN <5* 7  CREATININE 0.86 0.86  CALCIUM 8.9 9.1  MG 1.9  --    CBC:  Recent Labs Lab 07/26/14 1840 07/27/14 0555  WBC  --  4.8  HGB  --  11.5*  HCT 34.1 33.4*  MCV  --  85.2  PLT  --  215    Scheduled Meds: . ALPRAZolam  1 mg Oral TID  . dextromethorphan-guaiFENesin  1 tablet Oral BID  . docusate sodium  100 mg Oral BID  . enoxaparin (LOVENOX) injection  40 mg Subcutaneous Q24H  . fluticasone  2 spray Each Nare Daily  . levothyroxine  88 mcg Oral QAC breakfast  . loratadine  10 mg Oral Daily  . morphine  15 mg Oral Q12H  . multivitamin with minerals  1 tablet Oral Daily  . QUEtiapine  300 mg Oral QHS  . sodium chloride  3 mL Intravenous Q12H  . temazepam  30 mg Oral QHS   Continuous Infusions:   Time spent 15 minutes Huey Bienenstockawood Elgergawy  MD [facility] Pager [phone]. If 7 PM - 7 AM, please contact night-coverage at [email or web address], password [email or web address] 07/30/2014, 9:34 AM  LOS: 4 days

## 2014-07-30 NOTE — Progress Notes (Signed)
Patient is discharged from room 4N04 at this time. Alert and in stable condition. Instructions read to patient and understanding verbalized. Left unit via wheelchair with mom and belongings at side.

## 2015-12-24 ENCOUNTER — Ambulatory Visit: Payer: Medicare Other | Admitting: Neurology

## 2015-12-29 ENCOUNTER — Ambulatory Visit: Payer: Medicare Other | Admitting: Neurology

## 2016-01-17 ENCOUNTER — Ambulatory Visit: Payer: Medicare Other | Admitting: Neurology

## 2016-01-17 ENCOUNTER — Other Ambulatory Visit: Payer: Self-pay

## 2016-01-17 DIAGNOSIS — Z029 Encounter for administrative examinations, unspecified: Secondary | ICD-10-CM

## 2016-02-10 ENCOUNTER — Ambulatory Visit: Payer: Medicare Other | Admitting: Neurology

## 2016-06-26 ENCOUNTER — Ambulatory Visit (INDEPENDENT_AMBULATORY_CARE_PROVIDER_SITE_OTHER): Payer: Self-pay | Admitting: Neurology

## 2016-06-26 ENCOUNTER — Encounter: Payer: Self-pay | Admitting: Neurology

## 2016-06-26 VITALS — BP 109/73 | HR 94 | Ht 65.0 in | Wt 162.6 lb

## 2016-06-26 DIAGNOSIS — F445 Conversion disorder with seizures or convulsions: Secondary | ICD-10-CM | POA: Insufficient documentation

## 2016-06-26 DIAGNOSIS — G43701 Chronic migraine without aura, not intractable, with status migrainosus: Secondary | ICD-10-CM | POA: Insufficient documentation

## 2016-06-26 DIAGNOSIS — R635 Abnormal weight gain: Secondary | ICD-10-CM

## 2016-06-26 DIAGNOSIS — M5416 Radiculopathy, lumbar region: Secondary | ICD-10-CM
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
## 2016-06-26 NOTE — Patient Instructions (Signed)
Remember to drink plenty of fluid, eat healthy meals and do not skip any meals. Try to eat protein with a every meal and eat a healthy snack such as fruit or nuts in between meals. Try to keep a regular sleep-wake schedule and try to exercise daily, particularly in the form of walking, 20-30 minutes a day, if you can.   As far as your medications are concerned, I would like to suggest: botox  As far as diagnostic testing: physical therapy, therapy/psychiatry follow up  I would like to see you back in for botox for migraine, sooner if we need to. Please call us with any interim questions, concerns, problems, updates or refill requests.   Please also call us for any test results so we can go over those with you on the phone.  My clinical assistant and will answer any of your questions and relay your messages to me and also relay most of my messages to you.   Our phone number is [phone]. We also have an after hours call service for urgent matters and there is a physician on-call for urgent questions. For any emergencies you know to call 911 or go to the nearest emergency room

## 2016-06-27 ENCOUNTER — Telehealth: Payer: Self-pay | Admitting: *Deleted

## 2016-06-27 LAB — CBC
Hematocrit: 36.6 % (ref 34.0–46.6)
Hemoglobin: 12.2 g/dL (ref 11.1–15.9)
MCH: 29.3 pg (ref 26.6–33.0)
MCHC: 33.3 g/dL (ref 31.5–35.7)
MCV: 88 fL (ref 79–97)
PLATELETS: 319 10*3/uL (ref 150–379)
RBC: 4.16 x10E6/uL (ref 3.77–5.28)
RDW: 12.8 % (ref 12.3–15.4)
WBC: 8.4 10*3/uL (ref 3.4–10.8)

## 2016-06-27 LAB — COMPREHENSIVE METABOLIC PANEL
A/G RATIO: 1.7 (ref 1.2–2.2)
ALK PHOS: 94 IU/L (ref 39–117)
ALT: 29 IU/L (ref 0–32)
AST: 11 IU/L (ref 0–40)
Albumin: 4.3 g/dL (ref 3.5–5.5)
BUN/Creatinine Ratio: 13 (ref 9–23)
BUN: 11 mg/dL (ref 6–24)
CHLORIDE: 100 mmol/L (ref 96–106)
CO2: 25 mmol/L (ref 18–29)
Calcium: 9 mg/dL (ref 8.7–10.2)
Creatinine, Ser: 0.84 mg/dL (ref 0.57–1.00)
GFR calc non Af Amer: 83 mL/min/{1.73_m2} (ref >59)
GFR, EST AFRICAN AMERICAN: 96 mL/min/{1.73_m2} (ref >59)
Globulin, Total: 2.6 g/dL (ref 1.5–4.5)
Glucose: 92 mg/dL (ref 65–99)
POTASSIUM: 4.3 mmol/L (ref 3.5–5.2)
Sodium: 138 mmol/L (ref 134–144)
TOTAL PROTEIN: 6.9 g/dL (ref 6.0–8.5)

## 2016-06-27 LAB — THYROID PANEL WITH TSH
FREE THYROXINE INDEX: 1.2 (ref 1.2–4.9)
T3 UPTAKE RATIO: 29 % (ref 24–39)
T4, Total: 4.1 ug/dL — ABNORMAL LOW (ref 4.5–12.0)
TSH: 0.897 u[IU]/mL (ref 0.450–4.500)

## 2016-06-27 NOTE — Telephone Encounter (Signed)
-----   Message from Anson FretAntonia B Ahern, MD sent at 06/27/2016  7:23 AM EST ----- Labs look fine thanks

## 2016-06-27 NOTE — Telephone Encounter (Signed)
Called and spoke to patient about lab results per AA,MD note. She verbalized understanding. She would like results forwarded to PCP, Dr August Saucerean.  Sent copy of results via EPIC to PCP.

## 2016-07-01 DIAGNOSIS — M5416 Radiculopathy, lumbar region: Secondary | ICD-10-CM | POA: Insufficient documentation

## 2016-07-06 ENCOUNTER — Telehealth: Payer: Self-pay | Admitting: Neurology

## 2016-07-06 NOTE — Telephone Encounter (Signed)
Called and left patient a message relaying [name] was out of the office until 07/12/2016. Relayed [name] will call her back with updated status on Botox.

## 2016-07-06 NOTE — Telephone Encounter (Signed)
Noted, thank you

## 2016-07-11 ENCOUNTER — Encounter (HOSPITAL_COMMUNITY): Payer: Self-pay | Admitting: Emergency Medicine

## 2016-07-11 ENCOUNTER — Emergency Department (HOSPITAL_COMMUNITY)
Admission: EM | Admit: 2016-07-11 | Discharge: 2016-07-11 | Disposition: A | Discharge status: Home / Self Care | Payer: Medicare Other | Attending: Emergency Medicine | Admitting: Emergency Medicine

## 2016-07-11 DIAGNOSIS — E039 Hypothyroidism, unspecified: Secondary | ICD-10-CM | POA: Insufficient documentation

## 2016-07-11 DIAGNOSIS — F10120 Alcohol abuse with intoxication, uncomplicated: Secondary | ICD-10-CM | POA: Insufficient documentation

## 2016-07-11 DIAGNOSIS — F10921 Alcohol use, unspecified with intoxication delirium: Secondary | ICD-10-CM

## 2016-07-11 DIAGNOSIS — F10920 Alcohol use, unspecified with intoxication, uncomplicated: Secondary | ICD-10-CM

## 2016-07-11 DIAGNOSIS — Z91040 Latex allergy status: Secondary | ICD-10-CM | POA: Insufficient documentation

## 2016-07-11 DIAGNOSIS — F10121 Alcohol abuse with intoxication delirium: Secondary | ICD-10-CM | POA: Insufficient documentation

## 2016-07-11 DIAGNOSIS — Z79899 Other long term (current) drug therapy: Secondary | ICD-10-CM | POA: Insufficient documentation

## 2016-07-11 DIAGNOSIS — J45909 Unspecified asthma, uncomplicated: Secondary | ICD-10-CM | POA: Insufficient documentation

## 2016-07-11 LAB — CBC WITH DIFFERENTIAL/PLATELET
BASOS ABS: 0 10*3/uL (ref 0.0–0.1)
Basophils Relative: 0 %
Eosinophils Absolute: 0 10*3/uL (ref 0.0–0.7)
Eosinophils Relative: 1 %
HCT: 37.1 % (ref 36.0–46.0)
Hemoglobin: 12.8 g/dL (ref 12.0–15.0)
Lymphocytes Relative: 30 %
Lymphs Abs: 1.8 10*3/uL (ref 0.7–4.0)
MCH: 29.9 pg (ref 26.0–34.0)
MCHC: 34.5 g/dL (ref 30.0–36.0)
MCV: 86.7 fL (ref 78.0–100.0)
Monocytes Absolute: 0.6 10*3/uL (ref 0.1–1.0)
Monocytes Relative: 11 %
NEUTROS ABS: 3.5 10*3/uL (ref 1.7–7.7)
NEUTROS PCT: 58 %
Platelets: 248 10*3/uL (ref 150–400)
RBC: 4.28 MIL/uL (ref 3.87–5.11)
RDW: 13 % (ref 11.5–15.5)
WBC: 6 10*3/uL (ref 4.0–10.5)

## 2016-07-11 LAB — COMPREHENSIVE METABOLIC PANEL
ALT: 40 U/L (ref 14–54)
ANION GAP: 6 (ref 5–15)
AST: 25 U/L (ref 15–41)
Albumin: 4.3 g/dL (ref 3.5–5.0)
Alkaline Phosphatase: 79 U/L (ref 38–126)
BILIRUBIN TOTAL: 0.2 mg/dL — AB (ref 0.3–1.2)
BUN: 6 mg/dL (ref 6–20)
CALCIUM: 8.8 mg/dL — AB (ref 8.9–10.3)
CO2: 28 mmol/L (ref 22–32)
Chloride: 108 mmol/L (ref 101–111)
Creatinine, Ser: 0.62 mg/dL (ref 0.44–1.00)
Glucose, Bld: 116 mg/dL — ABNORMAL HIGH (ref 65–99)
Potassium: 4.5 mmol/L (ref 3.5–5.1)
Sodium: 142 mmol/L (ref 135–145)
TOTAL PROTEIN: 6.8 g/dL (ref 6.5–8.1)

## 2016-07-11 LAB — RAPID URINE DRUG SCREEN, HOSP PERFORMED
AMPHETAMINES: NOT DETECTED
BARBITURATES: POSITIVE — AB
BENZODIAZEPINES: POSITIVE — AB
Cocaine: NOT DETECTED
Opiates: NOT DETECTED
Tetrahydrocannabinol: NOT DETECTED

## 2016-07-11 LAB — ACETAMINOPHEN LEVEL: Acetaminophen (Tylenol), Serum: <10 ug/mL — ABNORMAL LOW (ref 10–30)

## 2016-07-11 LAB — SALICYLATE LEVEL: Salicylate Lvl: <7 mg/dL (ref 2.8–30.0)

## 2016-07-11 LAB — PREGNANCY, URINE: Preg Test, Ur: NEGATIVE

## 2016-07-11 LAB — ETHANOL: Alcohol, Ethyl (B): 174 mg/dL — ABNORMAL HIGH (ref <5)

## 2016-07-11 MED ORDER — ACETAMINOPHEN 325 MG PO TABS
650.0000 mg | ORAL_TABLET | Freq: Once | ORAL | Status: AC
  Administered 2016-07-11: 650 mg via ORAL
  Filled 2016-07-11: qty 2

## 2016-07-11 NOTE — ED Notes (Signed)
Pt ambulated to the restroom with no assistance.

## 2016-07-11 NOTE — ED Triage Notes (Signed)
Per EMS pt is intoxicated[facility] [place] [employer] [facility] Pt currently taking medication for seizures and anti-anxiety. Only known hx is of migraines and seizures. CBG 70. Pt unable to answer any questions at this time.

## 2016-07-11 NOTE — ED Provider Notes (Signed)
Patient visit shared.  Patient here after drinking too much alcohol last night. She is more arousable in the emergency department this morning. She endorses drinking alcohol, denies any street drugs. She denies any SI or HI. She is clinically sober on examination and ambulates without difficulty. Plan to DC home with outpatient follow-up   [name], MD 07/11/16 [id number]

## 2016-07-11 NOTE — ED Provider Notes (Signed)
[facility]-EMERGENCY DEPT Provider Note   CSN: [id number] Arrival date & time: 07/11/16  0057  By signing my name below, I, Majel Homer, attest that this documentation has been prepared under the direction and in the presence of Derwood Kaplan, MD . Electronically Signed: Majel Homer, Scribe. 07/11/2016. 2:17 AM.   History   Chief Complaint Chief Complaint  Patient presents with  . Alcohol Intoxication   The history is provided by the patient and the EMS personnel. No language interpreter was used.   LEVEL V CAVEAT and Limited ROS due to AMS  HPI Comments: [name] is a 46 y.o. female with PMHx of fibromyalgia, seizures, and bipolar disorder, brought in by EMS to the Emergency Department for an evaluation s/p EtOH intoxication this evening. Per EMS, pt was drinking with family and friends at a [employer] and was intoxicated upon arrival. EMS states pt was combative en route to the ED. She now complains of associated nausea. Per nurse, pt has also had episodes of vomiting while in the ED. She denies pain and ingestion of any pills or drugs.    Past Medical History:  Diagnosis Date  . Anxiety   . Asthma   . Bipolar disorder (HCC)   . Chronic urticaria   . DDD (degenerative disc disease), cervical   . DDD (degenerative disc disease), lumbar   . Depression   . Fibromyalgia   . Fibromyalgia   . Hypothyroidism   . Kidney stones   . Migraine   . Panic attack   . Psoriatic arthritis (HCC)   . Raynaud's disease   . Seizure [facility])     Patient Active Problem List   Diagnosis Date Noted  . Lumbar radiculopathy, chronic 07/01/2016  . Chronic migraine without aura with status migrainosus, not intractable 06/26/2016  . Pseudoseizure 06/26/2016  . Syncope 07/26/2014  . Falls 07/26/2014  . Low back pain 07/26/2014  . Headache 07/26/2014  . History of seizures 07/26/2014  . Hypothyroidism 07/26/2014  . Hypokalemia 07/26/2014  . Bipolar disorder (HCC) 07/26/2014  . Generalized  anxiety disorder 07/26/2014  . Chronic urticaria 07/26/2014  . Asthma, chronic 07/26/2014    Past Surgical History:  Procedure Laterality Date  . ABDOMINAL HYSTERECTOMY    . BREAST ENHANCEMENT SURGERY    . CERVICAL SPINE SURGERY    . cholecystecomy    . LUMBAR SPINE SURGERY    . PARTIAL HYSTERECTOMY     bleeding  . RHINOPLASTY    . TONSILLECTOMY AND ADENOIDECTOMY    . tubal ligation      OB History    No data available     Home Medications    Prior to Admission medications   Medication Sig Start Date End Date Taking? Authorizing Provider  albuterol (PROVENTIL HFA;VENTOLIN HFA) 108 (90 BASE) MCG/ACT inhaler Inhale 1-2 puffs into the lungs every 4 (four) hours as needed for wheezing or shortness of breath.    Historical Provider, MD  ALPRAZolam Prudy Feeler) 1 MG tablet Take 1 mg by mouth daily.     Historical Provider, MD  amitriptyline (ELAVIL) 25 MG tablet Take 25 mg by mouth 2 (two) times daily. 1 pill in the morning  2 tablets at night 05/04/15   Historical Provider, MD  butalbital-acetaminophen-caffeine (FIORICET, ESGIC) 50-325-40 MG tablet Take 1 tablet by mouth as needed.  11/01/15   Historical Provider, MD  Calcium-Vitamin D-Vitamin K [phone] MG-UNT-MCG CHEW Chew 1 tablet by mouth daily.    Historical Provider, MD  cetirizine (ZYRTEC) 10 MG  tablet     Historical Provider, MD  chlordiazePOXIDE (LIBRIUM) 25 MG capsule Take 25 mg by mouth 3 (three) times daily.     Historical Provider, MD  Dextromethorphan-Guaifenesin (MUCINEX DM) 30-600 MG TB12 Take 1 tablet by mouth 2 (two) times daily.    Historical Provider, MD  EPINEPHrine 0.3 mg/0.3 mL IJ SOAJ injection Inject 0.3 mg into the muscle as needed (allergic reaction).    Historical Provider, MD  levothyroxine (SYNTHROID, LEVOTHROID) 100 MCG tablet Take 100 mcg by mouth daily before breakfast.     Historical Provider, MD  mometasone (NASONEX) 50 MCG/ACT nasal spray Place 2 sprays into both nostrils at bedtime.    Historical  Provider, MD  Multiple Vitamin (MULTIVITAMIN WITH MINERALS) TABS tablet Take 1 tablet by mouth daily.    Historical Provider, MD  PRESCRIPTION MEDICATION Apply 1 application topically daily as needed (psoriasis).    Historical Provider, MD  QUEtiapine (SEROQUEL) 300 MG tablet Take 150 mg by mouth at bedtime. 50mg  in the morning  100mg  at night    Historical Provider, MD  temazepam (RESTORIL) 15 MG capsule Take 15 mg by mouth at bedtime. 15-30mg     Historical Provider, MD    Family History Family History  Problem Relation Age of Onset  . Depression Mother   . Hyperlipidemia Mother   . Depression Father   . Diabetes Father   . COPD Father   . Kidney disease Father   . CAD Father     Social History Social History  Substance Use Topics  . Smoking status: Never Smoker  . Smokeless tobacco: Never Used  . Alcohol use No     Allergies   Codeine; Lyrica [pregabalin]; Percocet [oxycodone-acetaminophen]; Savella [milnacipran hcl]; Sulfa antibiotics; Coconut oil; Crestor [rosuvastatin]; Latex; Milk-related compounds; Strawberry extract; Iodine; and Nucynta [tapentadol]   Review of Systems Review of Systems  Unable to perform ROS: Mental status change   Physical Exam Updated Vital Signs BP 120/77   Pulse 92   Temp 97.2 F (36.2 C) (Axillary)   Resp 18   Ht 5\' 4"  (1.626 m)   Wt 162 lb (73.5 kg)   LMP  (LMP Unknown) Comment: Pt intoxicated and unable to answer question  SpO2 97%   BMI 27.81 kg/m   Physical Exam  Constitutional: She is oriented to person, place, and time. She appears well-developed and well-nourished.  HENT:  Head: Normocephalic and atraumatic.  Head exam shows no evidence of trauma.   Eyes: EOM are normal.  Neck: Normal range of motion.  No signs of midline C-spine tenderness.   Pulmonary/Chest: Effort normal.  Abdominal: She exhibits no distension.  Musculoskeletal: Normal range of motion.  BUE and BLE exam reveals no gross deformity or TTP. Pelvis is  stable.   Neurological: She is alert and oriented to person, place, and time.  Psychiatric: She has a normal mood and affect.  Nursing note and vitals reviewed.  ED Treatments / Results  Labs (all labs ordered are listed, but only abnormal results are displayed) Labs Reviewed  CBC WITH DIFFERENTIAL/PLATELET  ETHANOL  COMPREHENSIVE METABOLIC PANEL  RAPID URINE DRUG SCREEN, HOSP PERFORMED  PREGNANCY, URINE  ACETAMINOPHEN LEVEL  SALICYLATE LEVEL    EKG  EKG Interpretation None       Radiology No results found.  Procedures Procedures (including critical care time)  Medications Ordered in ED Medications - No data to display  DIAGNOSTIC STUDIES:  Oxygen Saturation is 97% on RA, normal by my interpretation.  COORDINATION OF CARE:  2:15 AM Discussed treatment plan with pt at bedside and pt agreed to plan.  Initial Impression / Assessment and Plan / ED Course  I have reviewed the triage vital signs and the nursing notes.  Pertinent labs & imaging results that were available during my care of the patient were reviewed by me and considered in my medical decision making (see chart for details).  Clinical Course     I personally performed the services described in this documentation, which was scribed in my presence. The recorded information has been reviewed and is accurate.  Pt comes in with ams. LEVEL 5 CAVEAT for intoxication. Head to toe eval shows no signs of trauma.  Pt still is confused at 9 am - Dr. Pecola Leisureeese will f/u. Labs ordered.     Final Clinical Impressions(s) / ED Diagnoses   Final diagnoses:  Acute alcoholic intoxication with delirium [facility](HCC)    New Prescriptions New Prescriptions   No medications on file     Derwood KaplanAnkit Darryll Raju, MD 07/11/16 [phone]

## 2016-07-11 NOTE — ED Notes (Signed)
phlebotomy collecting blood work at this time.

## 2016-07-11 NOTE — ED Notes (Signed)
Attempted to get number to call family for discharge.  Pt is confused.  Pt giving letters for numbers and stating her "deceased father can pick her up".  Pt cannot tell year.  Can state president and her name with date of birth.  Notified MD of concerns.

## 2016-07-11 NOTE — ED Notes (Signed)
Pt was asked to collect a urine sample when she went to the restroom but was unable to collect one.

## 2016-07-19 NOTE — Telephone Encounter (Signed)
Spoke with patient and she stated that she received new insurance. [employer] ID [id number]. When I called to check eligibility it told me they were not a member effective 07/10/16.   [phone]

## 2016-07-19 NOTE — Telephone Encounter (Signed)
Dr Ahern- FYI 

## 2016-07-19 NOTE — Telephone Encounter (Signed)
[facility] was no longer active with them. I spoke with Angie in billing who stated that at her last apt she ended up being charged as self pay because none of her insurance was active, [facility] or medicare. I called the patient to inform her of this and cancel her apt for botox tomorrow 07/20/16. She asked if she could be put on the schedule for next week and I told her she would have to speak with billing about making some payments before we could get her scheduled. She stated that she would call back.

## 2016-07-20 ENCOUNTER — Ambulatory Visit: Payer: Self-pay | Admitting: Neurology

## 2016-07-20 NOTE — Telephone Encounter (Signed)
Patient called back and wanted to know what time her apt was today. I informed her that per our conversation yesterday her apt had been cancelled due to her not having insurance. She said "oh ok" and then asked me if she should call her insurance. I told her that she could call us back after she had insurance and we could get her on the schedule. She wanted to know how quickly we could put her on the schedule and I reiterated to her that this would depend on how quickly she could get her insurance information to me. She told me that she has medicare currently. I informed her again that it was not showing active in our system and suggested she speak with billing. [facility].

## 2016-07-20 NOTE — Telephone Encounter (Signed)
Noted, thank you

## 2016-08-03 ENCOUNTER — Ambulatory Visit: Payer: Medicare Other | Attending: Neurology | Admitting: Physical Therapy

## 2016-08-04 ENCOUNTER — Encounter (HOSPITAL_COMMUNITY): Payer: Self-pay

## 2016-08-04 ENCOUNTER — Emergency Department (HOSPITAL_COMMUNITY): Payer: Self-pay

## 2016-08-04 ENCOUNTER — Emergency Department (HOSPITAL_COMMUNITY)
Admission: EM | Admit: 2016-08-04 | Discharge: 2016-08-04 | Disposition: A | Discharge status: Home / Self Care | Payer: Self-pay | Attending: Emergency Medicine | Admitting: Emergency Medicine

## 2016-08-04 DIAGNOSIS — Z91040 Latex allergy status: Secondary | ICD-10-CM | POA: Insufficient documentation

## 2016-08-04 DIAGNOSIS — Z79899 Other long term (current) drug therapy: Secondary | ICD-10-CM | POA: Insufficient documentation

## 2016-08-04 DIAGNOSIS — R51 Headache: Secondary | ICD-10-CM | POA: Insufficient documentation

## 2016-08-04 DIAGNOSIS — E039 Hypothyroidism, unspecified: Secondary | ICD-10-CM | POA: Insufficient documentation

## 2016-08-04 DIAGNOSIS — J45909 Unspecified asthma, uncomplicated: Secondary | ICD-10-CM | POA: Insufficient documentation

## 2016-08-04 DIAGNOSIS — R569 Unspecified convulsions: Secondary | ICD-10-CM | POA: Insufficient documentation

## 2016-08-04 LAB — BASIC METABOLIC PANEL
Anion gap: 8 (ref 5–15)
BUN: 19 mg/dL (ref 6–20)
CO2: 25 mmol/L (ref 22–32)
Calcium: 8.6 mg/dL — ABNORMAL LOW (ref 8.9–10.3)
Chloride: 99 mmol/L — ABNORMAL LOW (ref 101–111)
Creatinine, Ser: 0.75 mg/dL (ref 0.44–1.00)
GFR calc Af Amer: >60 mL/min (ref >60)
GFR calc non Af Amer: >60 mL/min (ref >60)
Glucose, Bld: 110 mg/dL — ABNORMAL HIGH (ref 65–99)
Potassium: 3.8 mmol/L (ref 3.5–5.1)
Sodium: 132 mmol/L — ABNORMAL LOW (ref 135–145)

## 2016-08-04 LAB — RAPID URINE DRUG SCREEN, HOSP PERFORMED
Amphetamines: NOT DETECTED
Barbiturates: NOT DETECTED
Benzodiazepines: POSITIVE — AB
Cocaine: NOT DETECTED
Opiates: NOT DETECTED
Tetrahydrocannabinol: NOT DETECTED

## 2016-08-04 LAB — CBC WITH DIFFERENTIAL/PLATELET
Basophils Absolute: 0 10*3/uL (ref 0.0–0.1)
Basophils Relative: 0 %
Eosinophils Absolute: 0.1 10*3/uL (ref 0.0–0.7)
Eosinophils Relative: 1 %
HCT: 34.2 % — ABNORMAL LOW (ref 36.0–46.0)
Hemoglobin: 11.9 g/dL — ABNORMAL LOW (ref 12.0–15.0)
Lymphocytes Relative: 14 %
Lymphs Abs: 1.5 10*3/uL (ref 0.7–4.0)
MCH: 29.5 pg (ref 26.0–34.0)
MCHC: 34.8 g/dL (ref 30.0–36.0)
MCV: 84.9 fL (ref 78.0–100.0)
Monocytes Absolute: 1.2 10*3/uL — ABNORMAL HIGH (ref 0.1–1.0)
Monocytes Relative: 12 %
Neutro Abs: 7.7 10*3/uL (ref 1.7–7.7)
Neutrophils Relative %: 73 %
Platelets: 211 10*3/uL (ref 150–400)
RBC: 4.03 MIL/uL (ref 3.87–5.11)
RDW: 12.5 % (ref 11.5–15.5)
WBC: 10.5 10*3/uL (ref 4.0–10.5)

## 2016-08-04 LAB — CBG MONITORING, ED: GLUCOSE-CAPILLARY: 135 mg/dL — AB (ref 65–99)

## 2016-08-04 MED ORDER — CHLORDIAZEPOXIDE HCL 25 MG PO CAPS: 25.0000 mg | ORAL_CAPSULE | Freq: Three times a day (TID) | ORAL | 0 refills | Status: AC | PRN

## 2016-08-04 MED ORDER — LORAZEPAM 2 MG/ML IJ SOLN
2.0000 mg | INTRAMUSCULAR | Status: DC | PRN
  Administered 2016-08-04: 1 mg via INTRAVENOUS
  Filled 2016-08-04: qty 1

## 2016-08-04 MED ORDER — METOCLOPRAMIDE HCL 5 MG/ML IJ SOLN
10.0000 mg | Freq: Once | INTRAMUSCULAR | Status: AC
  Administered 2016-08-04: 10 mg via INTRAVENOUS
  Filled 2016-08-04: qty 2

## 2016-08-04 MED ORDER — SODIUM CHLORIDE 0.9 % IV BOLUS (SEPSIS)
1000.0000 mL | Freq: Once | INTRAVENOUS | Status: AC
  Administered 2016-08-04: 1000 mL via INTRAVENOUS

## 2016-08-04 MED ORDER — ACETAMINOPHEN 500 MG PO TABS
1000.0000 mg | ORAL_TABLET | Freq: Once | ORAL | Status: AC
  Administered 2016-08-04: 1000 mg via ORAL
  Filled 2016-08-04: qty 2

## 2016-08-04 MED ORDER — KETOROLAC TROMETHAMINE 15 MG/ML IJ SOLN
15.0000 mg | Freq: Once | INTRAMUSCULAR | Status: AC
  Administered 2016-08-04: 15 mg via INTRAVENOUS
  Filled 2016-08-04: qty 1

## 2016-08-04 MED ORDER — DIPHENHYDRAMINE HCL 50 MG/ML IJ SOLN
25.0000 mg | Freq: Once | INTRAMUSCULAR | Status: AC
  Administered 2016-08-04: 25 mg via INTRAVENOUS
  Filled 2016-08-04: qty 1

## 2016-08-04 MED ORDER — CHLORDIAZEPOXIDE HCL 25 MG PO CAPS
25.0000 mg | ORAL_CAPSULE | Freq: Once | ORAL | Status: AC
  Administered 2016-08-04: 25 mg via ORAL
  Filled 2016-08-04: qty 1

## 2016-08-04 MED ORDER — ACETAMINOPHEN 325 MG PO TABS
650.0000 mg | ORAL_TABLET | Freq: Once | ORAL | Status: AC
  Administered 2016-08-04: 650 mg via ORAL
  Filled 2016-08-04: qty 2

## 2016-08-04 NOTE — ED Provider Notes (Signed)
[facility]-EMERGENCY DEPT Provider Note   CSN: [id number] Arrival date & time: 08/04/16  1400     History   Chief Complaint Chief Complaint  Patient presents with  . Seizures    HPI [name] is a 46 y.o. female.  HPI   [age] YOF presents today with complaints of seizure. Patient notes that yesterday she was feeling "off" but had no specific complaints. Patient was brought via EMS as she fell down and had a seizure per her son who is home. Patient notes that her tongue hurts, the back and neck hurts and she has a generalized headache. Patient notes a history of seizures, last seizure 1-2 months ago.Patient notes that she has not been taking her Librium that is prescribed for seizures. She notes she had an episode approximately 1-2 months ago when she was not taking her Librium, and recently had a seizure today. She notes this happens when she does not take her medication. Patient is seen and neurologist for migraine headaches, but cannot recall the name of the neurologist. She denies any drug or alcohol use. Patient also notes pain to her left pinky toe after a fall several days ago.   Past Medical History:  Diagnosis Date  . Anxiety   . Asthma   . Bipolar disorder (HCC)   . Chronic urticaria   . DDD (degenerative disc disease), cervical   . DDD (degenerative disc disease), lumbar   . Depression   . Fibromyalgia   . Fibromyalgia   . Hypothyroidism   . Kidney stones   . Migraine   . Panic attack   . Psoriatic arthritis (HCC)   . Raynaud's disease   . Seizure [facility](HCC)     Patient Active Problem List   Diagnosis Date Noted  . Lumbar radiculopathy, chronic 07/01/2016  . Chronic migraine without aura with status migrainosus, not intractable 06/26/2016  . Pseudoseizure 06/26/2016  . Syncope 07/26/2014  . Falls 07/26/2014  . Low back pain 07/26/2014  . Headache 07/26/2014  . History of seizures 07/26/2014  . Hypothyroidism 07/26/2014  . Hypokalemia 07/26/2014  . Bipolar  disorder (HCC) 07/26/2014  . Generalized anxiety disorder 07/26/2014  . Chronic urticaria 07/26/2014  . Asthma, chronic 07/26/2014    Past Surgical History:  Procedure Laterality Date  . ABDOMINAL HYSTERECTOMY    . BREAST ENHANCEMENT SURGERY    . CERVICAL SPINE SURGERY    . cholecystecomy    . LUMBAR SPINE SURGERY    . PARTIAL HYSTERECTOMY     bleeding  . RHINOPLASTY    . TONSILLECTOMY AND ADENOIDECTOMY    . tubal ligation      OB History    No data available       Home Medications    Prior to Admission medications   Medication Sig Start Date End Date Taking? Authorizing Provider  albuterol (PROVENTIL HFA;VENTOLIN HFA) 108 (90 BASE) MCG/ACT inhaler Inhale 1-2 puffs into the lungs every 4 (four) hours as needed for wheezing or shortness of breath.   Yes Historical Provider, MD  ALPRAZolam Prudy Feeler(XANAX) 1 MG tablet Take 1 mg by mouth daily as needed for anxiety.    Yes Historical Provider, MD  amitriptyline (ELAVIL) 25 MG tablet Take 25-50 mg by mouth 2 (two) times daily. 1 pill in the morning  2 tablets at night 05/04/15  Yes Historical Provider, MD  Calcium-Vitamin D-Vitamin K [phone] MG-UNT-MCG CHEW Chew 1 tablet by mouth daily.   Yes Historical Provider, MD  cetirizine (ZYRTEC) 10 MG tablet Take  10 mg by mouth at bedtime.    Yes Historical Provider, MD  chlordiazePOXIDE (LIBRIUM) 25 MG capsule Take 25 mg by mouth 3 (three) times daily.    Yes Historical Provider, MD  Dextromethorphan-Guaifenesin (MUCINEX DM) 30-600 MG TB12 Take 1 tablet by mouth 2 (two) times daily as needed (cold, congetion).    Yes Historical Provider, MD  EPINEPHrine 0.3 mg/0.3 mL IJ SOAJ injection Inject 0.3 mg into the muscle as needed (allergic reaction).   Yes Historical Provider, MD  levothyroxine (SYNTHROID, LEVOTHROID) 137 MCG tablet Take 137 mcg by mouth daily before breakfast.   Yes Historical Provider, MD  mometasone (NASONEX) 50 MCG/ACT nasal spray Place 2 sprays into both nostrils at bedtime.    Yes Historical Provider, MD  Multiple Vitamin (MULTIVITAMIN WITH MINERALS) TABS tablet Take 1 tablet by mouth daily.   Yes Historical Provider, MD  QUEtiapine (SEROQUEL) 100 MG tablet Take 50-100 mg by mouth 2 (two) times daily. 50 mg in the am and 100 mg at night.   Yes Historical Provider, MD  temazepam (RESTORIL) 30 MG capsule Take 30 mg by mouth at bedtime.   Yes Historical Provider, MD  chlordiazePOXIDE (LIBRIUM) 25 MG capsule Take 1 capsule (25 mg total) by mouth 3 (three) times daily as needed for anxiety. 08/04/16   Eyvonne Mechanic, PA-C    Family History Family History  Problem Relation Age of Onset  . Depression Mother   . Hyperlipidemia Mother   . Depression Father   . Diabetes Father   . COPD Father   . Kidney disease Father   . CAD Father     Social History Social History  Substance Use Topics  . Smoking status: Never Smoker  . Smokeless tobacco: Never Used  . Alcohol use No     Allergies   Codeine; Lyrica [pregabalin]; Percocet [oxycodone-acetaminophen]; Savella [milnacipran hcl]; Sulfa antibiotics; Coconut oil; Crestor [rosuvastatin]; Latex; Milk-related compounds; Strawberry extract; Iodine; and Nucynta [tapentadol]   Review of Systems Review of Systems  All other systems reviewed and are negative.    Physical Exam Updated Vital Signs BP 127/73   Pulse 97   Temp 97.6 F (36.4 C) (Oral)   Resp 16   LMP  (LMP Unknown) Comment: Pt intoxicated and unable to answer question  SpO2 98%   Physical Exam  Constitutional: She is oriented to person, place, and time. She appears well-developed and well-nourished.  HENT:  Head: Normocephalic and atraumatic.  Eyes: Conjunctivae are normal. Pupils are equal, round, and reactive to light. Right eye exhibits no discharge. Left eye exhibits no discharge. No scleral icterus.  Neck: Normal range of motion. No JVD present. No tracheal deviation present.  Pulmonary/Chest: Effort normal. No stridor. No respiratory  distress. She has no rales. She exhibits no tenderness.  Musculoskeletal:  Bruising noted to the left lateral foot, tenderness to palpation of the proximal phalanx of the left fifth digit  Neurological: She is alert and oriented to person, place, and time. She has normal strength. No cranial nerve deficit or sensory deficit. Coordination normal. GCS eye subscore is 4. GCS verbal subscore is 5. GCS motor subscore is 6.  Psychiatric: She has a normal mood and affect. Her behavior is normal. Judgment and thought content normal.  Nursing note and vitals reviewed.    ED Treatments / Results  Labs (all labs ordered are listed, but only abnormal results are displayed) Labs Reviewed  CBC WITH DIFFERENTIAL/PLATELET - Abnormal; Notable for the following:  Result Value   Hemoglobin 11.9 (*)    HCT 34.2 (*)    Monocytes Absolute 1.2 (*)    All other components within normal limits  BASIC METABOLIC PANEL - Abnormal; Notable for the following:    Sodium 132 (*)    Chloride 99 (*)    Glucose, Bld 110 (*)    Calcium 8.6 (*)    All other components within normal limits  RAPID URINE DRUG SCREEN, HOSP PERFORMED - Abnormal; Notable for the following:    Benzodiazepines POSITIVE (*)    All other components within normal limits  CBG MONITORING, ED - Abnormal; Notable for the following:    Glucose-Capillary 135 (*)    All other components within normal limits    EKG  EKG Interpretation  Date/Time:  Friday August 04 2016 14:10:41 EST Ventricular Rate:  101 PR Interval:    QRS Duration: 97 QT Interval:  376 QTC Calculation: 488 R Axis:   50 Text Interpretation:  Sinus tachycardia LAE, consider biatrial enlargement Borderline prolonged QT interval Confirmed by Lincoln Brigham [phone]) on 08/04/2016 2:28:48 PM       Radiology Ct Head Wo Contrast  Result Date: 08/04/2016 CLINICAL DATA:  Seizure, neck and head pain.  Headache. EXAM: CT HEAD WITHOUT CONTRAST TECHNIQUE: Contiguous axial images were  obtained from the base of the skull through the vertex without intravenous contrast. COMPARISON:  Multiple exams, including 07/29/2014 and 07/26/2014 FINDINGS: Brain: Mildly advanced for age cerebral atrophy as on prior exams. Otherwise, the brainstem, cerebellum, cerebral peduncles, thalami, basal ganglia, basilar cisterns, and ventricular system appear within normal limits. No intracranial hemorrhage, mass lesion, or acute CVA. Vascular: Unremarkable Skull: Unremarkable Sinuses/Orbits: Unremarkable Other: No supplemental non-categorized findings. IMPRESSION: 1. No acute intracranial abnormalities. 2. 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 Electronically Signed   By: Gaylyn Rong M.D.   On: 08/04/2016 18:42   Ct Cervical Spine Wo Contrast  Result Date: 08/04/2016 CLINICAL DATA:  Neck pain after seizure. EXAM: CT CERVICAL SPINE WITHOUT CONTRAST TECHNIQUE: Multidetector CT imaging of the cervical spine was performed without intravenous contrast. Multiplanar CT image reconstructions were also generated. COMPARISON:  None. FINDINGS: Alignment: Normal. Skull base and vertebrae: No acute fracture. No primary bone lesion or focal pathologic process. Soft tissues and spinal canal: No prevertebral fluid or swelling. No visible canal hematoma. Disc levels: Status post surgical anterior fusion of C4-5. Remaining disc spaces appear intact. Upper chest: Negative. Other: Mild degenerative changes are seen involving posterior facet joints bilaterally. IMPRESSION: Status post surgical anterior fusion of C4-5. No acute abnormality seen in the cervical spine. Electronically Signed   By: Lupita Raider, M.D.   On: 08/04/2016 15:22   Dg Foot Complete Left  Result Date: 08/04/2016 CLINICAL DATA:  Left foot pain since a fall down steps secondary to a seizure 2 days ago. EXAM: LEFT FOOT - COMPLETE 3+ VIEW COMPARISON:  None. FINDINGS: The patient has an acute nondisplaced diaphyseal fracture of the  proximal phalanx of the little toe. The tuft of the distal phalanx of the third toe is absent and the distal phalanx itself is sclerotic. There is sclerosis in the base of the distal phalanx of the fourth toe and a tiny bony remnant of the tuft is identified. Imaged bones otherwise appear normal. Soft tissues are unremarkable. IMPRESSION: Acute nondisplaced fracture proximal phalanx of the left little toe. No other acute abnormality. Changes and distal phalanges of the second and third toes are of unknown etiology but  appear chronic. Electronically Signed   By: Drusilla Kanner M.D.   On: 08/04/2016 14:54    Procedures Procedures (including critical care time)  Medications Ordered in ED Medications  LORazepam (ATIVAN) injection 2 mg (1 mg Intravenous Given 08/04/16 1645)  sodium chloride 0.9 % bolus 1,000 mL (0 mLs Intravenous Stopped 08/04/16 1646)  acetaminophen (TYLENOL) tablet 650 mg (650 mg Oral Given 08/04/16 1516)  chlordiazePOXIDE (LIBRIUM) capsule 25 mg (25 mg Oral Given 08/04/16 1516)  ketorolac (TORADOL) 15 MG/ML injection 15 mg (15 mg Intravenous Given 08/04/16 1843)  metoCLOPramide (REGLAN) injection 10 mg (10 mg Intravenous Given 08/04/16 1843)  diphenhydrAMINE (BENADRYL) injection 25 mg (25 mg Intravenous Given 08/04/16 1843)  sodium chloride 0.9 % bolus 1,000 mL (1,000 mLs Intravenous New Bag/Given 08/04/16 1935)     Initial Impression / Assessment and Plan / ED Course  I have reviewed the triage vital signs and the nursing notes.  Pertinent labs & imaging results that were available during my care of the patient were reviewed by me and considered in my medical decision making (see chart for details).     Patient presents with seizure. She had several seizures here in the ED. I suspect this is likely due to not having any benzodiazepines recently. I discussed her case with neurology who agreed that this presentation was consistent with benzodiazepine. Patient is back to her baseline  here in the ED, her mother is present who will be staying with her tonight. Discussion of thin patient versus outpatient management, family agrees that outpatient management would be most appropriate for this patient. She is very well appearing, has a negative neurological exam. Patient's symptoms are likely related to acute change in medications. Patient will be given her medication here, she will be encouraged to take as directed at home, follow-up with neurology and her primary care for medical management and ongoing evaluation. She is instructed return if she has return of seizures, or has any new or worsening signs or symptoms. Mother is very comfortable taking patient home, no questions or concerns at time of discharge. Patient care was shared with Dr. Madilyn Hook who personally evaluated the patient and agreed to my assessment and plan.  Final Clinical Impressions(s) / ED Diagnoses   Final diagnoses:  Seizure (HCC)    New Prescriptions New Prescriptions   CHLORDIAZEPOXIDE (LIBRIUM) 25 MG CAPSULE    Take 1 capsule (25 mg total) by mouth 3 (three) times daily as needed for anxiety.     Eyvonne Mechanic, PA-C 08/04/16 2135    Tilden Fossa, MD 08/15/16 [phone]

## 2016-08-04 NOTE — ED Notes (Signed)
Bed: [id number] Expected date:  Expected time:  Means of arrival:  Comments: 46 yo seizures

## 2016-08-04 NOTE — ED Notes (Signed)
PT DISCHARGED. INSTRUCTIONS AND PRESCRIPTION GIVEN. AAOX4. PT IN NO APPARENT DISTRESS. THE OPPORTUNITY TO ASK QUESTIONS WAS PROVIDED. 

## 2016-08-04 NOTE — ED Triage Notes (Signed)
BIB EMS from Home, EMS reports pt had seizure witnessed by her [age]yr old son. Pt was in a standing position prior to seizure. Pt was post-ictal upon EMS arrival. Pt arrives with towel around neck, c/o neck and head pain. Pt is A+OX4.   EMS Vitals BP 123/68 P 104 SPO2 98% on RA CBG 97

## 2016-08-04 NOTE — Discharge Instructions (Signed)
Please read attached information. If you experience any new or worsening signs or symptoms please return to the emergency room for evaluation. Please follow-up with your primary care provider or specialist as discussed. Please use medication prescribed only as directed and discontinue taking if you have any concerning signs or symptoms.   °

## 2016-08-16 ENCOUNTER — Telehealth: Payer: Self-pay | Admitting: Neurology

## 2016-08-16 NOTE — Telephone Encounter (Signed)
Pt called says insurance is effective 09/07/16 for botox coverage: [employer] ID# [id number] GRP# [id number] RXBIN# [id number]  Medicare

## 2016-09-13 NOTE — Telephone Encounter (Signed)
Patient called office in reference to scheduling her BOTOX.  Per patient she has an Artistapproval letter from [employer].  Please call

## 2016-09-14 NOTE — Telephone Encounter (Signed)
I called the patient regarding her botox but she did not answer and did not have an option for VM.

## 2016-09-21 ENCOUNTER — Telehealth: Payer: Self-pay | Admitting: Neurology

## 2016-09-21 NOTE — Telephone Encounter (Signed)
Patient calling to schedule Botox. [place]

## 2016-09-25 NOTE — Telephone Encounter (Signed)
I called the patient to schedule injection. She requested to cancel her apt for tomorrow 03/20. I informed her of the cancellation fee and told her she could speak to billing before we could reschedule. We went ahead and scheduled injection but left her apt for tomorrow. I transferred her to billing.

## 2016-09-26 ENCOUNTER — Ambulatory Visit: Payer: Medicare Other | Admitting: Neurology

## 2016-09-27 ENCOUNTER — Ambulatory Visit: Payer: Medicare Other | Admitting: Neurology

## 2016-09-27 ENCOUNTER — Telehealth: Payer: Self-pay

## 2016-09-27 NOTE — Telephone Encounter (Signed)
Pt no-showed her follow-up appt this morning. 

## 2016-10-05 ENCOUNTER — Encounter: Payer: Self-pay | Admitting: Neurology

## 2016-10-05 ENCOUNTER — Ambulatory Visit (INDEPENDENT_AMBULATORY_CARE_PROVIDER_SITE_OTHER): Payer: Medicare PPO | Admitting: Neurology

## 2016-10-05 VITALS — BP 120/78 | HR 94 | Ht 65.0 in | Wt 148.6 lb

## 2016-10-05 DIAGNOSIS — G43701 Chronic migraine without aura, not intractable, with status migrainosus: Secondary | ICD-10-CM | POA: Diagnosis not present

## 2016-10-05 NOTE — Progress Notes (Signed)
Botox 100 units/vial x 2 vials from floor stock NDC [phone] Lot [id number] Exp 10 2020  Diluted in 4 ml of Bacteriostatic 0.9% NaCl NDC [id number] Lot [id number] Exp [id number]  Topical pain reliever applied to injection areas w/ gauze Lidocaine 5% cream NDC [id number] Lot [id number] Exp 06/19

## 2016-10-05 NOTE — Progress Notes (Signed)

## 2016-12-06 ENCOUNTER — Telehealth: Payer: Self-pay | Admitting: Neurology

## 2016-12-06 NOTE — Telephone Encounter (Signed)
Pt said up to 2 weeks ago she had'nt had any migraines but they have returned and would like to know what will be done at this point, she said the Botox did help.

## 2016-12-08 NOTE — Telephone Encounter (Signed)
Pt was last seen for Botox 10/05/16 and has been scheduled for next injections 01/12/17. She has a hx of depression/anxiety, asthma, fibromyalgia, hypothyroidism, kidney stones, psoriatic arthritis, Raynaud's and pseudoseizure. Currently prescribed Librium, Xanax, Seroquel and Elavil.

## 2016-12-08 NOTE — Telephone Encounter (Signed)
I called and scheduled the patient for her next injection. She would like to know what she can do in the meantime for her headaches. Please call and advise.

## 2016-12-10 NOTE — Telephone Encounter (Signed)
[name], give her next available appt with megan, she has failed multiple medications and they can discuss erenumab  thanks

## 2016-12-11 ENCOUNTER — Telehealth: Payer: Self-pay | Admitting: Neurology

## 2016-12-11 MED ORDER — RIZATRIPTAN BENZOATE 10 MG PO TBDP: 10.0000 mg | ORAL_TABLET | ORAL | 11 refills | Status: AC | PRN

## 2016-12-11 MED ORDER — ONDANSETRON 4 MG PO TBDP: 4.0000 mg | ORAL_TABLET | Freq: Three times a day (TID) | ORAL | 3 refills | Status: AC

## 2016-12-11 NOTE — Telephone Encounter (Signed)
Called pt and offered sooner appt w/ nurse practitioner. She may call back if she'd like to schedule 1st available w/ Megan to discuss additional medication management of migraines.

## 2016-12-11 NOTE — Telephone Encounter (Signed)
Patient has had a migraine for the last 2 weeks. Prescribed her Maxalt and discussed side effects and also Zofran 3 times a day. She can try the Maxalt with the Zofran. Maxalt can be repeated in 2 hours but no more than twice in 1 day. Also discussed Erenumab  medication and patient would like to go forward I will ask Victorino DikeJennifer to call her to discuss. Discussed side effects of this new medication and also that its new so there possibly will be new side effects reported.

## 2016-12-13 NOTE — Telephone Encounter (Signed)
Erenumab service request form and rx completed, awaiting MD signature.

## 2016-12-14 NOTE — Telephone Encounter (Signed)
Form signed by MD. Jeanene Erballed pt to discuss new medication and ask her to come in to office to sign pt authorization form. However, no answer and VM box not set up. Will try again later.

## 2016-12-20 ENCOUNTER — Telehealth: Payer: Self-pay | Admitting: Neurology

## 2016-12-20 MED ORDER — INDOMETHACIN 25 MG PO CAPS: 25.0000 mg | ORAL_CAPSULE | Freq: Three times a day (TID) | ORAL | 0 refills | Status: DC | PRN

## 2016-12-20 NOTE — Addendum Note (Signed)
Addended by: Donnelly AngelicaHOGAN, JENNIFER L on: 12/20/2016 05:22 PM   Modules accepted: Orders

## 2016-12-20 NOTE — Telephone Encounter (Signed)
Please send in indomethacin 25 mg #30 one po tid prn ewith food  No refills

## 2016-12-20 NOTE — Telephone Encounter (Signed)
New rx e-scribed to pharmacy. Pt notified via TC. Pharmacy verified and possible side effects reviewed. Verbalized understanding and appreciation for call.

## 2016-12-20 NOTE — Telephone Encounter (Signed)
Pt calling to inform that off and on for 3 weeks she has been dealing with a migraine. The last medication prescribed helps some but not 100% pt would like a call back with other recommendations.  Pt also mention that her face and eyelids are swollen, she took some zyrtec because she thought it was allergery related, please call

## 2016-12-20 NOTE — Telephone Encounter (Signed)
Pt had Botox injections for migraines on 10/05/16 and has next treatment scheduled 01/12/17. Before most recent Botox, she had a change in neuro providers so had about a 9 mo lapse in treatment. She called last week w/ ongoing migraine and was prescribed Maxalt and Zofran. She has tried taking these prn but still has unrelieved migraine. Discussed new preventive med w/ pt (erenumab) and she agreed to come by the office to sign service request form for [facility] Free Trial offer and to start PA process. Form completed and left at front desk for pt review/signature.

## 2017-01-12 ENCOUNTER — Encounter: Payer: Self-pay | Admitting: Neurology

## 2017-01-12 ENCOUNTER — Encounter (INDEPENDENT_AMBULATORY_CARE_PROVIDER_SITE_OTHER): Payer: Self-pay

## 2017-01-12 ENCOUNTER — Ambulatory Visit (INDEPENDENT_AMBULATORY_CARE_PROVIDER_SITE_OTHER): Payer: Medicare PPO | Admitting: Neurology

## 2017-01-12 VITALS — BP 127/80 | HR 109 | Ht 65.0 in | Wt 151.6 lb

## 2017-01-12 DIAGNOSIS — G43701 Chronic migraine without aura, not intractable, with status migrainosus: Secondary | ICD-10-CM

## 2017-01-12 NOTE — Progress Notes (Signed)

## 2017-01-12 NOTE — Progress Notes (Signed)
[facility] [phone] Lot [id number] Exp 01 2021  Diluted in 4 ml of Bacteriostatic 0.9% NaCl NDC [id number] Lot [id number] Exp [id number]  Topical pain reliever applied to injection areas w/ gauze Lidocaine 5% cream NDC [phone] Lot [id number] Exp 06/19

## 2017-01-16 ENCOUNTER — Telehealth: Payer: Self-pay

## 2017-01-16 NOTE — Telephone Encounter (Signed)
Aimovig service request form and rx completed, signed and faxed to [employer]. 

## 2017-03-15 ENCOUNTER — Telehealth: Payer: Self-pay | Admitting: Neurology

## 2017-03-15 NOTE — Telephone Encounter (Signed)
Botox apt for 10/11 needs to be r/s. Dr. Ahern will be off. °

## 2017-03-27 NOTE — Telephone Encounter (Signed)
Patient calling for status of her Aimovig.

## 2017-03-28 NOTE — Telephone Encounter (Signed)
I spoke to pt and she will call the 1800 [facility] and see what they say.  If issues will call us back.

## 2017-03-28 NOTE — Telephone Encounter (Signed)
Please let her know that Aimovig is delayed, she can call [email or web address]-aimovig to get an estimate of arrival.

## 2017-04-12 ENCOUNTER — Telehealth: Payer: Self-pay | Admitting: Neurology

## 2017-04-12 NOTE — Telephone Encounter (Signed)
Called and spoke with the patient. She has not received her first sample dose thru the mail yet. I will get this script sent over to her pharmacy that she has on file so that we can better start the process

## 2017-04-13 NOTE — Telephone Encounter (Signed)
Received fax from [facility], needing address.  I called and updated the address for the pt.  (had [employer] vs [employer]).  [phone].

## 2017-04-16 ENCOUNTER — Telehealth: Payer: Self-pay | Admitting: Neurology

## 2017-04-16 NOTE — Telephone Encounter (Signed)
Patient called office in reference to needing to reschedule Botox appointment on 04/17/17.  Please call

## 2017-04-17 ENCOUNTER — Ambulatory Visit: Payer: Medicare PPO | Admitting: Neurology

## 2017-04-17 NOTE — Telephone Encounter (Signed)
I called the patient to reschedule, she did not answer so I left a VM asking her to call back.

## 2017-04-18 ENCOUNTER — Other Ambulatory Visit: Payer: Self-pay | Admitting: Neurology

## 2017-04-18 ENCOUNTER — Ambulatory Visit: Payer: Medicare PPO | Admitting: Neurology

## 2017-04-18 NOTE — Telephone Encounter (Signed)
With patients Medicare status we will plan to keep the patients script thru [facility] and let them fill for the patient rather then sending to pharmacy.

## 2017-04-19 ENCOUNTER — Ambulatory Visit: Payer: Medicare PPO | Admitting: Neurology

## 2017-04-26 ENCOUNTER — Ambulatory Visit: Payer: Medicare PPO | Admitting: Neurology

## 2017-05-07 ENCOUNTER — Telehealth: Payer: Self-pay | Admitting: Neurology

## 2017-05-07 NOTE — Telephone Encounter (Signed)
Patient has had 3 no shows in the last year let me check with angie. Do not schedule her for botox again thanks

## 2017-05-07 NOTE — Telephone Encounter (Signed)
Pt called back, I advised her at this time we are waiting for answer as to whether she can be r/s or not. [place], she's had 3 seizures in the past week and can't drive. Pt was made aware of the no show policy, advised she had 3 no shows this year and cannot r/s at this time.

## 2017-05-07 NOTE — Telephone Encounter (Signed)
Patient called and cancelled her botox apt for the 5th time. She would like to know if Dr. Lucia GaskinsAhern can call in some nausea medication for her. Please call and advise.

## 2017-05-08 ENCOUNTER — Encounter: Payer: Self-pay | Admitting: Neurology

## 2017-05-09 ENCOUNTER — Ambulatory Visit: Payer: Medicare PPO | Admitting: Neurology

## 2017-05-09 ENCOUNTER — Telehealth: Payer: Self-pay | Admitting: Neurology

## 2017-05-09 NOTE — Telephone Encounter (Signed)
Left patient message to discuss a new referral and confirm that her pcp would be able to send that. Left my number for her to return call

## 2017-05-09 NOTE — Telephone Encounter (Signed)
Pt called on yesterday evening and stated that by law Dr Hern has to give her a referral to [place].  Pt asked for a referral within the zip code range [place] [phone].  Pt was made aware that she has dismissed from the practice.  Pt can be reached at [phone]

## 2017-05-09 NOTE — Telephone Encounter (Signed)
I don;t [place],  she has to see her pcp for that. We are required to be here for her 30 days after dismissal until she can see her pcp and get a referral. I can ask Zella BallRobin to confirm this and see if she can call her.  thanks

## 2017-05-28 ENCOUNTER — Telehealth: Payer: Self-pay | Admitting: Neurology

## 2017-05-28 NOTE — Telephone Encounter (Signed)
No, sorry this is per office policy thanks

## 2017-05-28 NOTE — Telephone Encounter (Signed)
Pt called she is aware she has been dismissed but she is begging to have botox one more time. Said she has someone that can drive her now. Pt said she is not able to see neurologist until 07/26/17. I have relayed the msg from Dr Lucia GaskinsAhern to the pt that she has c/a 5 appt for botox and Dr Lucia GaskinsAhern did not want to r/s any botox appt's. Pt said aimovig has been delivered to her now. Pt is set up to see new PCP 12/11. I advised the pt I could send a msg but it would be the providers decision. She can be reached at [phone]

## 2017-05-29 NOTE — Telephone Encounter (Signed)
Called patient back. I informed her that Dr. Lucia GaskinsAhern states no per office policy, since has been dismissed from the practice. She verbalized understanding. She stated that the Aimovig finally came to her house. She asked me what to do with it. I told her that it is her decision; we are no longer responsible and will not be managing any further. She stated she did not want to take the medication without a doctor knowing. She had no further questions.

## 2017-05-30 ENCOUNTER — Ambulatory Visit: Payer: Medicare PPO | Admitting: Neurology

## 2017-06-28 ENCOUNTER — Other Ambulatory Visit: Payer: Self-pay | Admitting: Neurology

## 2017-07-19 ENCOUNTER — Telehealth: Payer: Self-pay | Admitting: *Deleted

## 2017-07-19 NOTE — Telephone Encounter (Signed)
Return green card from the post office 

## 2017-09-05 NOTE — Telephone Encounter (Signed)
Error

## 2017-12-26 ENCOUNTER — Other Ambulatory Visit: Payer: Self-pay | Admitting: Neurology

## 2018-11-20 IMAGING — CR DG FOOT COMPLETE 3+V*L*
3 series · 3 of 3 positions shown · non-contrast
Comparison: None.

CLINICAL DATA: Left foot pain since a fall down steps secondary to
a seizure 2 days ago.

EXAM:
LEFT FOOT - COMPLETE 3+ VIEW

[x foot ap left]
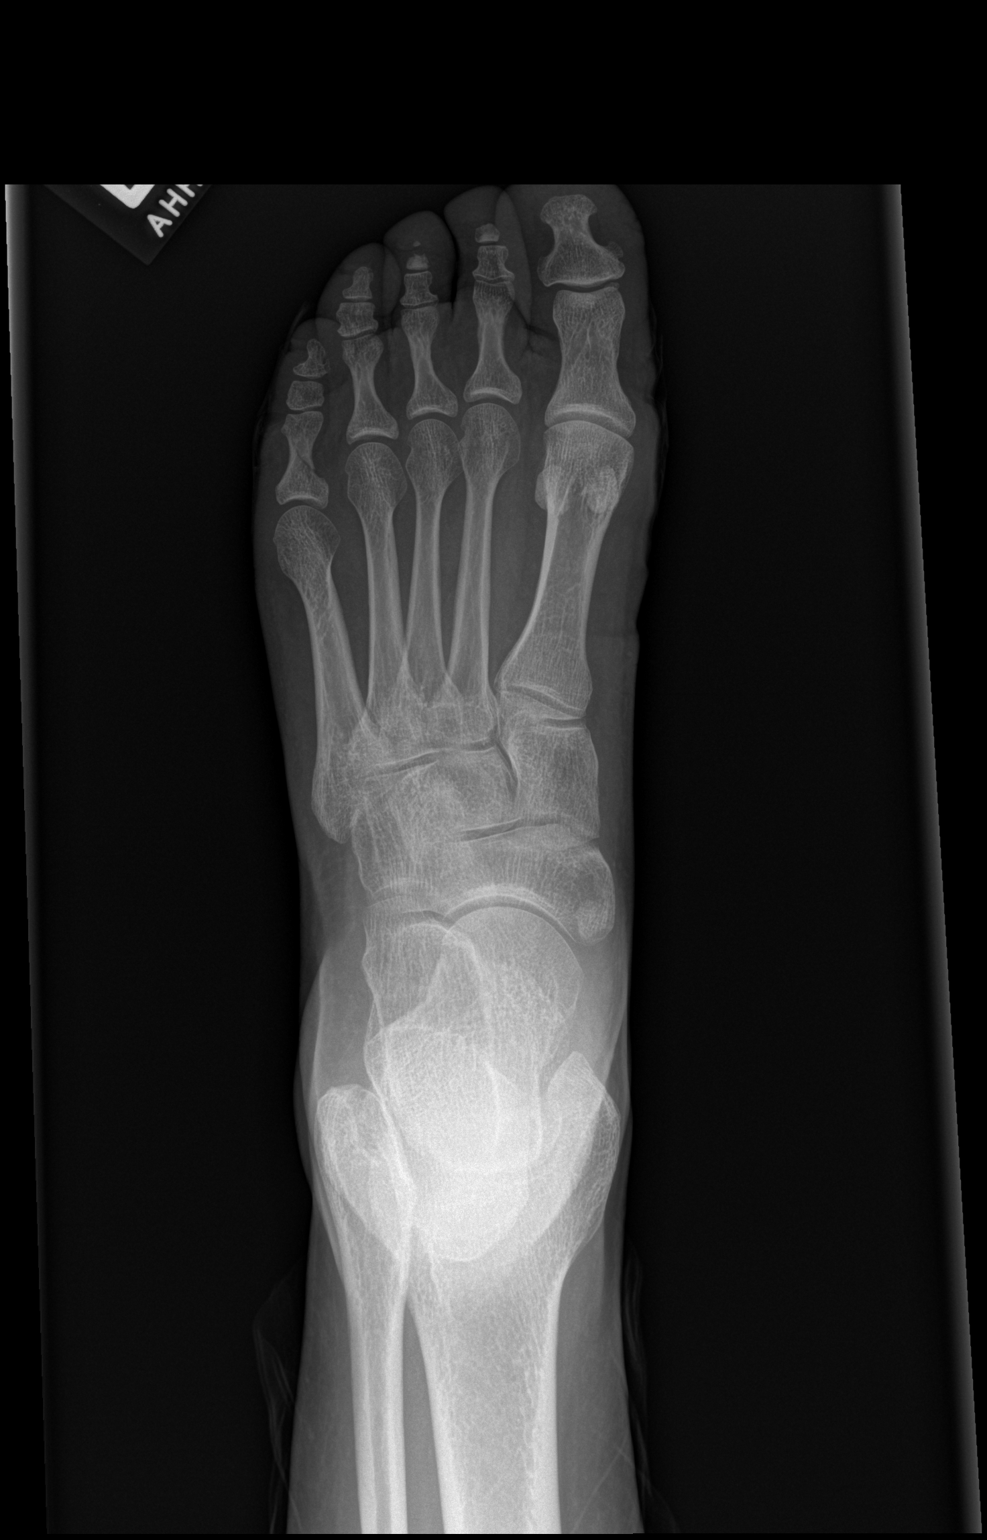

[x foot obl left]
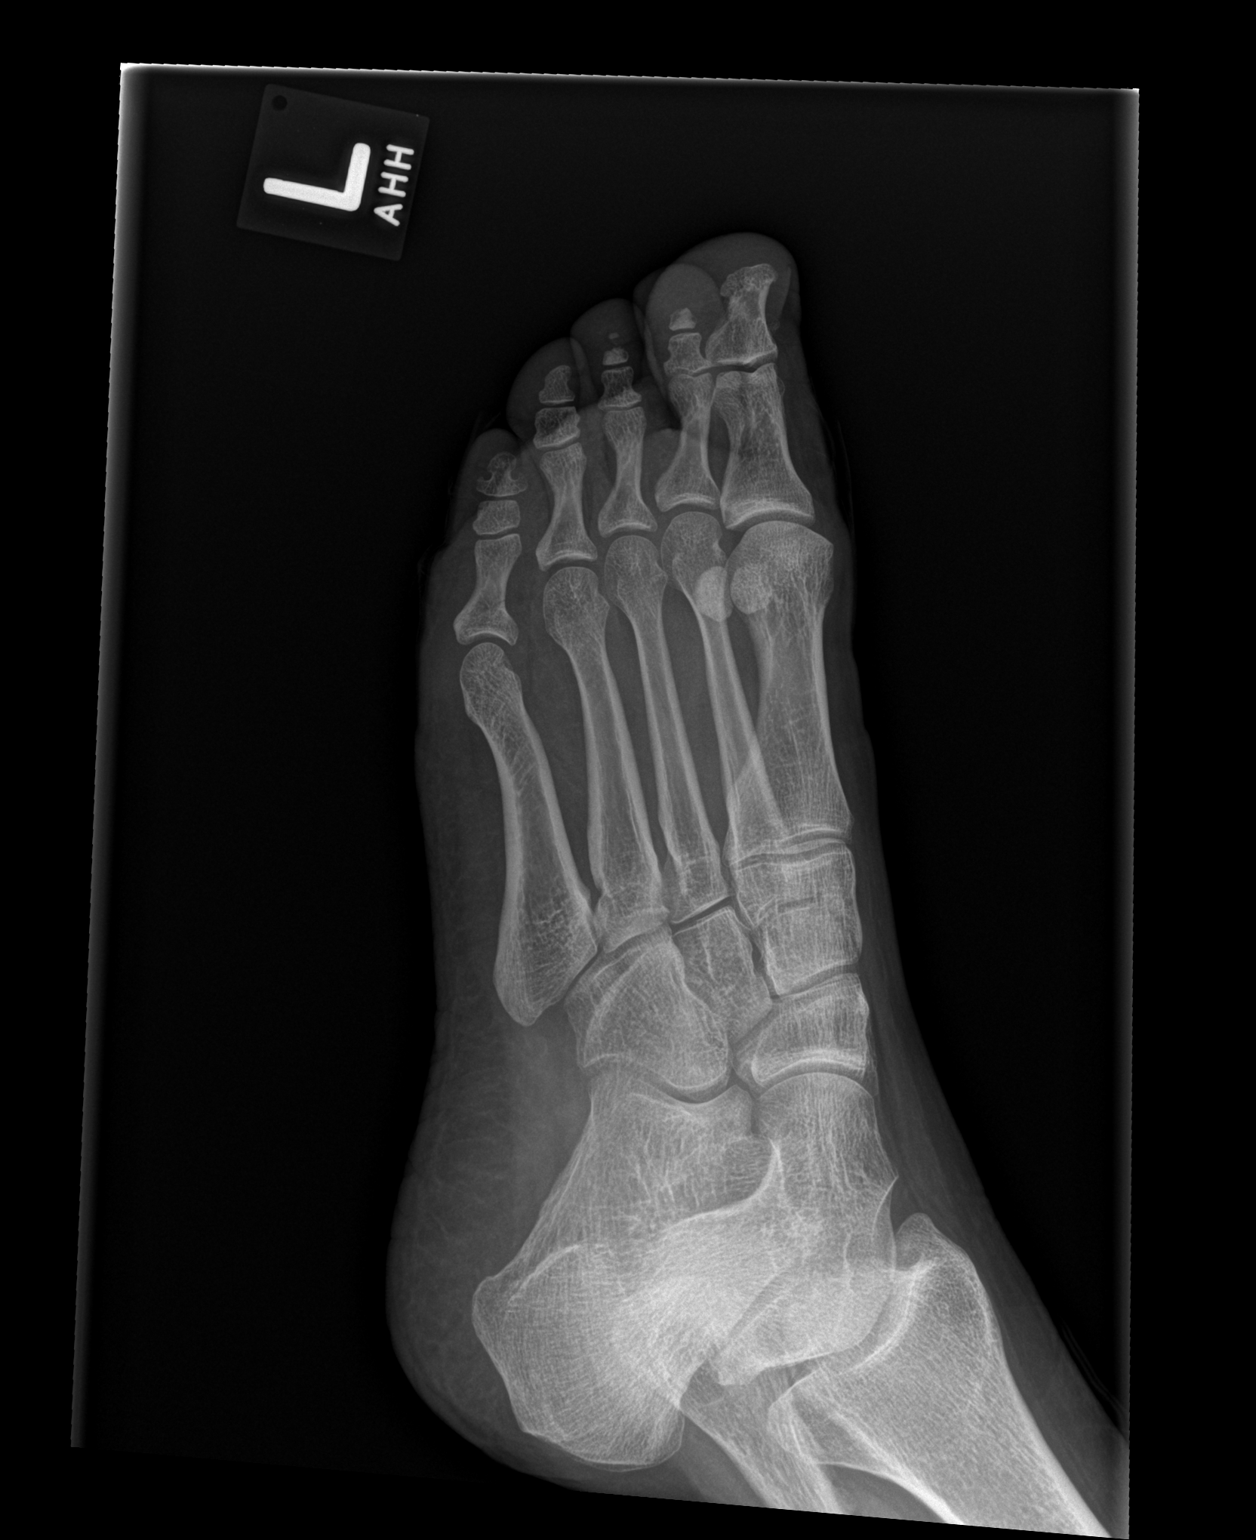

[x foot lat left]
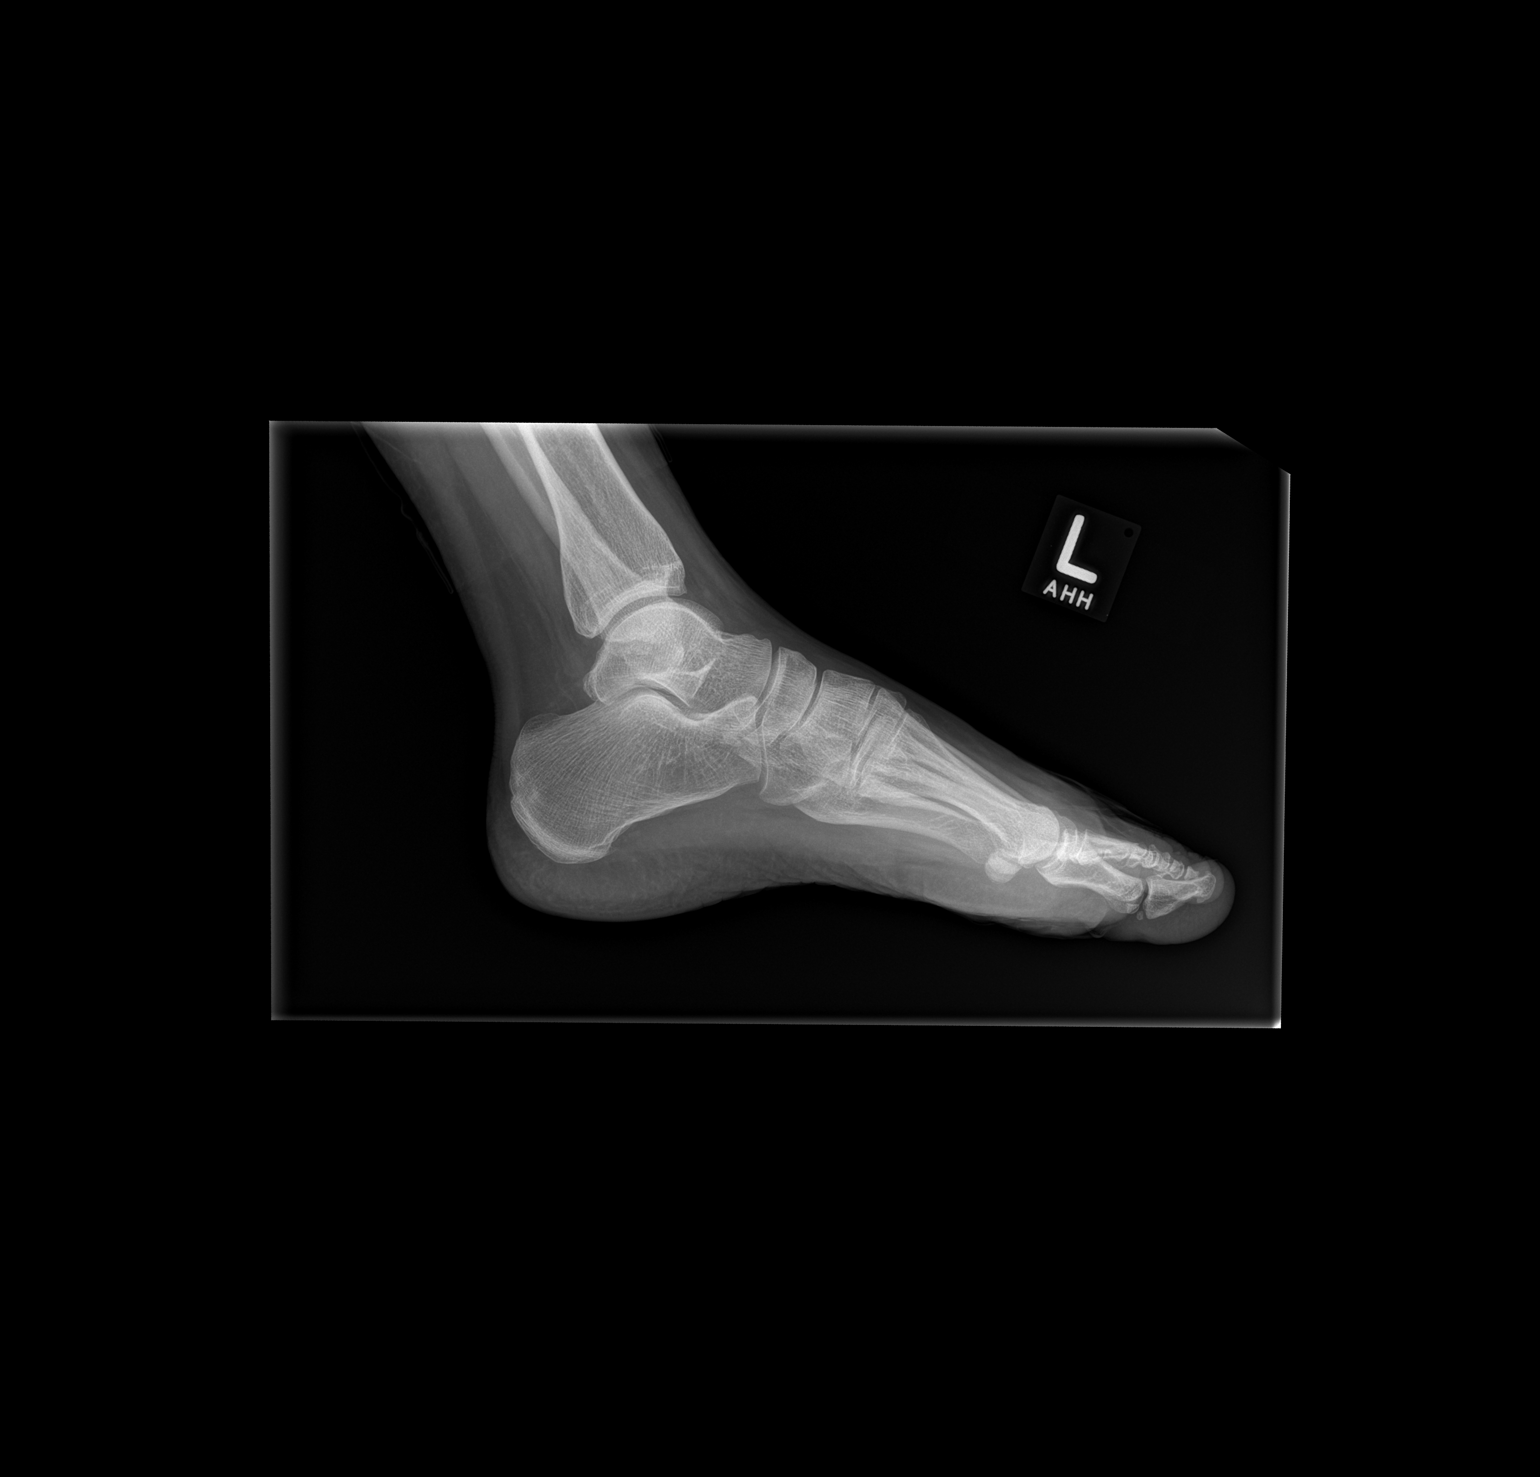

[3 of 3 positions shown; findings below may reference images not displayed]

FINDINGS: The patient has an acute nondisplaced diaphyseal fracture of the
proximal phalanx of the little toe. The tuft of the distal phalanx
of the third toe is absent and the distal phalanx itself is
sclerotic. There is sclerosis in the base of the distal phalanx of
the fourth toe and a tiny bony remnant of the tuft is identified.
Imaged bones otherwise appear normal. Soft tissues are unremarkable.
IMPRESSION: Acute nondisplaced fracture proximal phalanx of the left little toe.
No other acute abnormality.

Changes and distal phalanges of the second and third toes are of
unknown etiology but appear chronic.

## 2018-11-20 IMAGING — CT CT HEAD W/O CM
3 of 4 series · 14 of 47 positions shown, 16 images · non-contrast
Comparison: Multiple exams, including 07/29/2014 and 07/26/2014

CLINICAL DATA: Seizure, neck and head pain.  Headache.

EXAM:
CT HEAD WITHOUT CONTRAST
TECHNIQUE: Contiguous axial images were obtained from the base of the skull
through the vertex without intravenous contrast.

[Series 2: head w/o · axial · non-contrast · 0.45mm/px · z∈[-194,-74]mm · 8 of 31 slices shown, 10 images]
[im 4/31  brain]
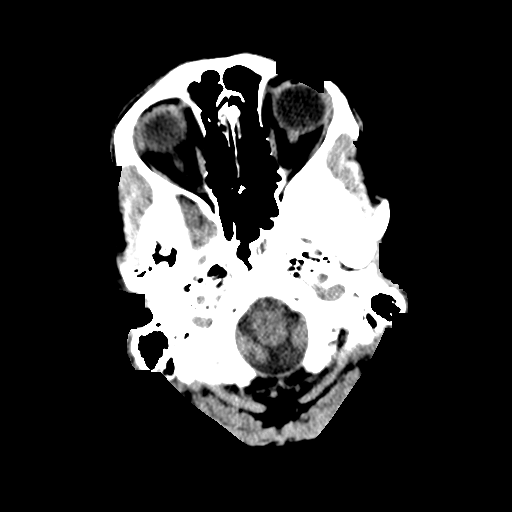
[im 4/31  bone]
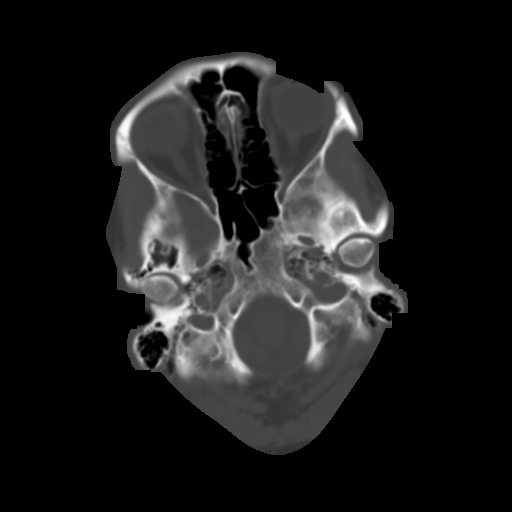
[im 7/31  brain]
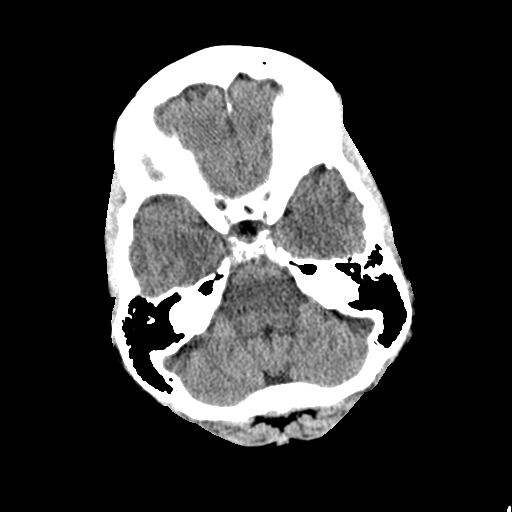
[im 10/31  brain]
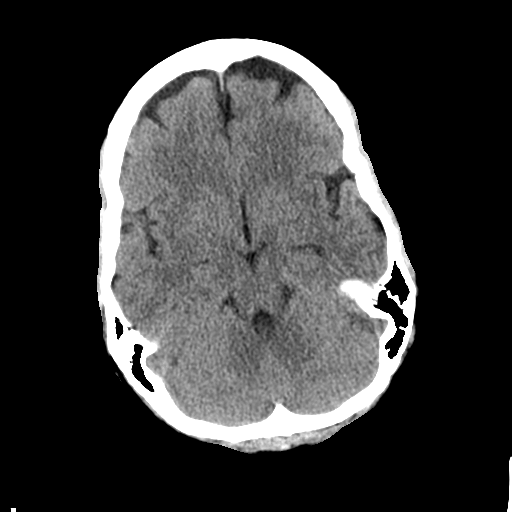
[im 13/31  brain]
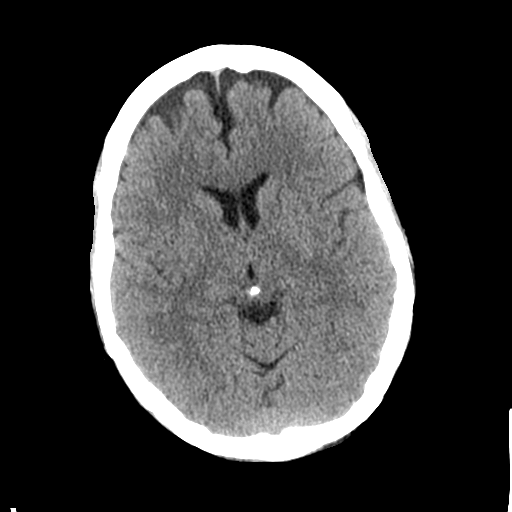
[im 19/31  brain]
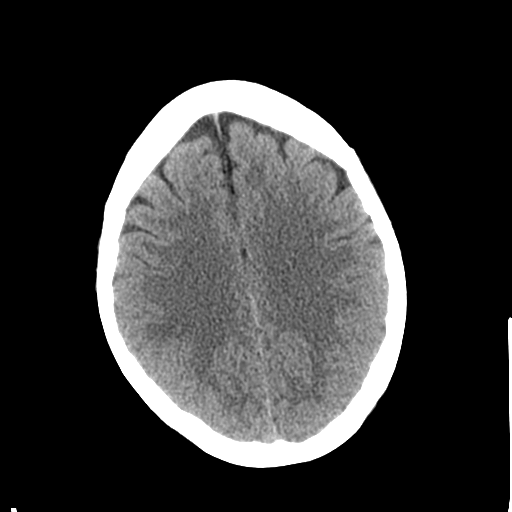
[im 19/31  bone]
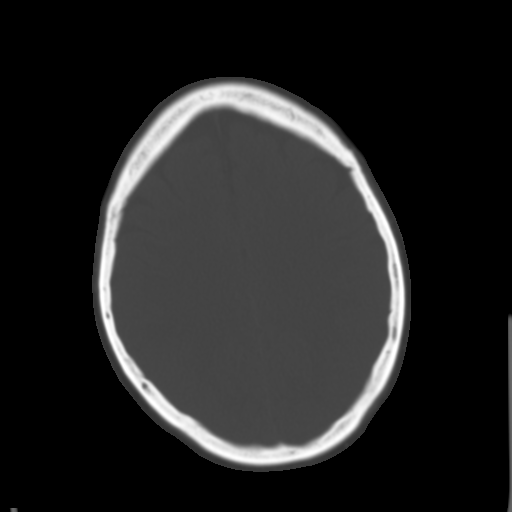
[im 22/31  brain]
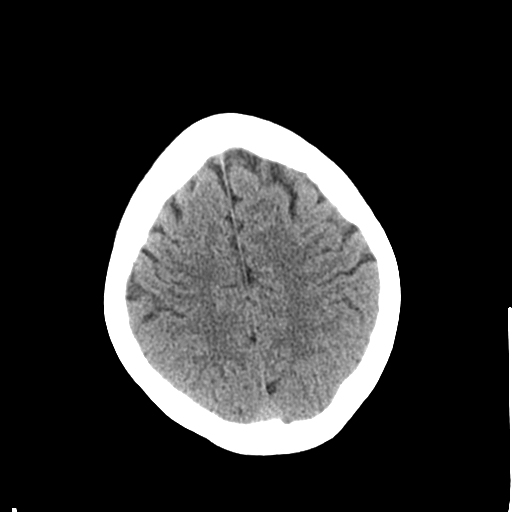
[im 25/31  brain]
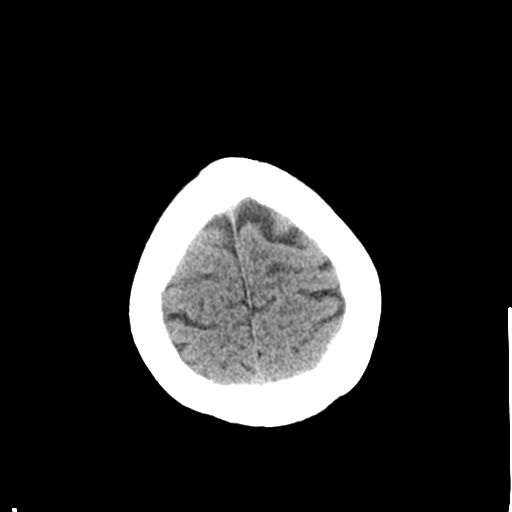
[im 28/31  brain]
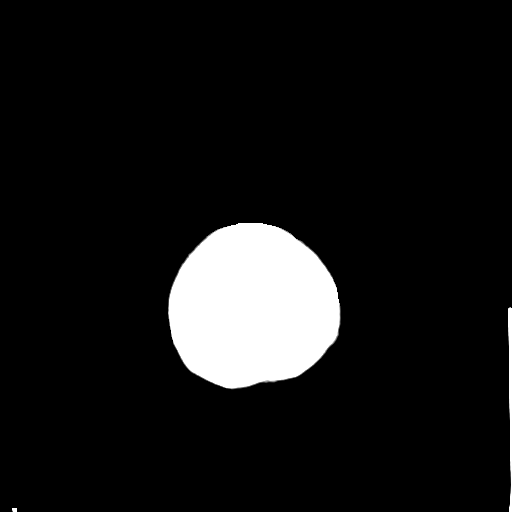

[Series 4: coronal · coronal · 0.29mm/px · 3 of 77 slices shown]
[im 26/77  brain]
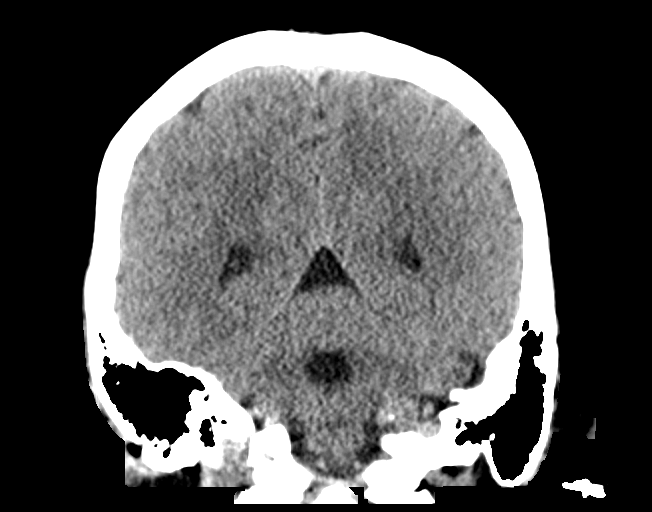
[im 34/77  brain]
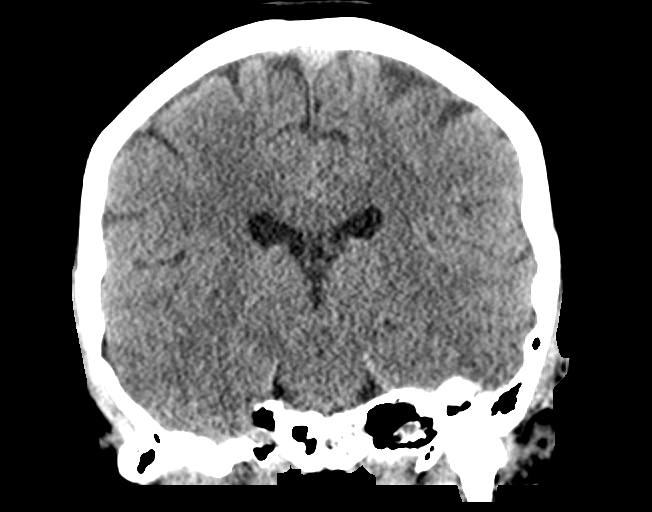
[im 43/77  brain]
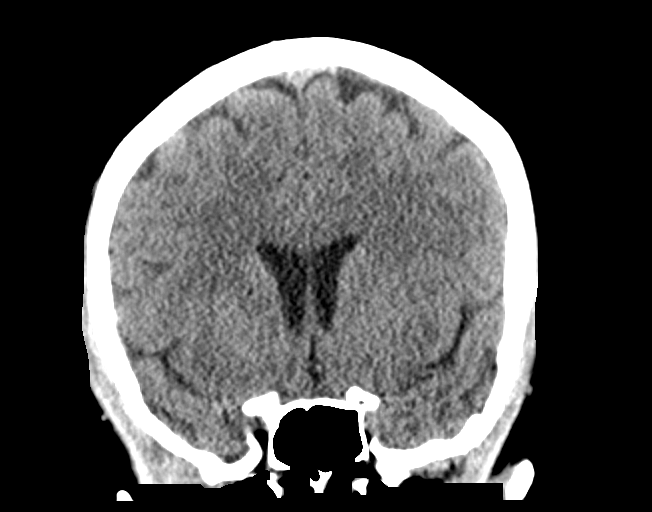

[Series 5: sagittal · sagittal · 0.29mm/px · 3 of 63 slices shown]
[im 21/63  brain]
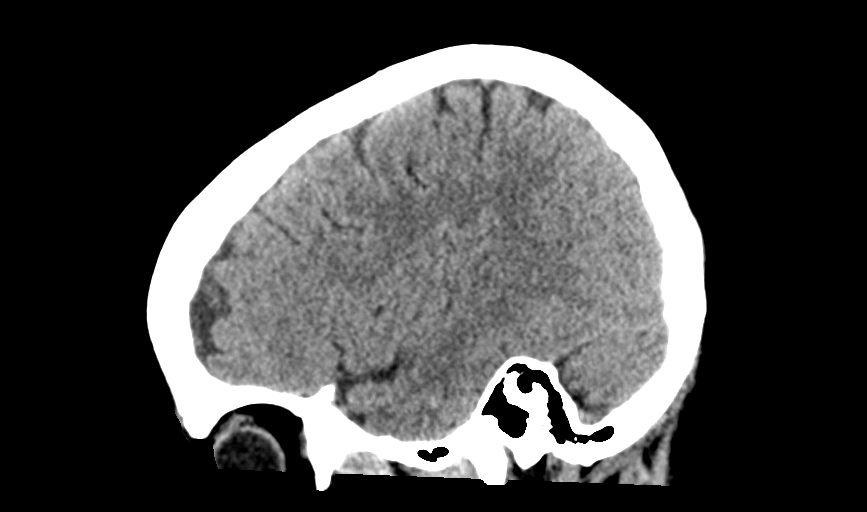
[im 32/63  brain]
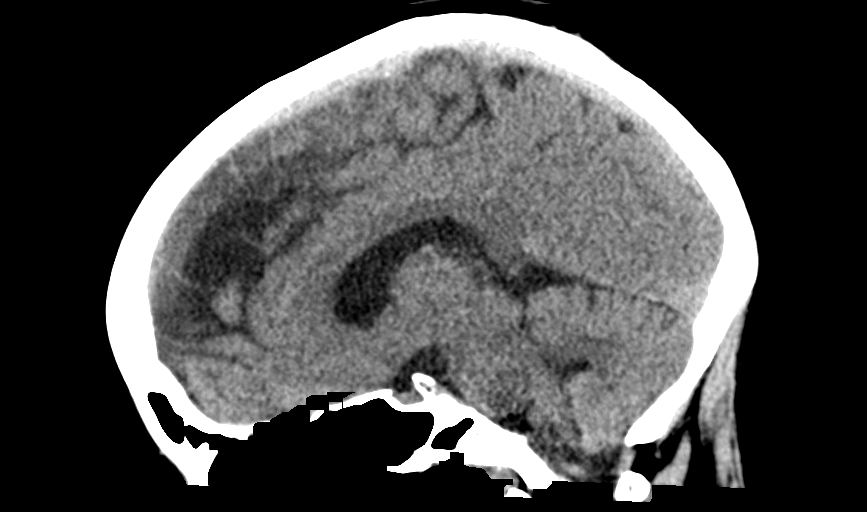
[im 42/63  brain]
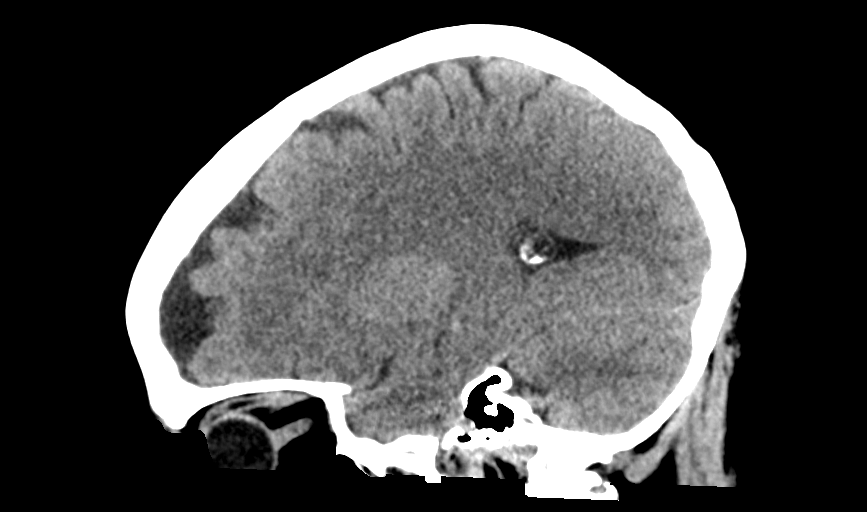

[14 of 47 positions shown; findings below may reference images not displayed]

FINDINGS: Brain: Mildly advanced for age cerebral atrophy as on prior exams.

Otherwise, the brainstem, cerebellum, cerebral peduncles, thalami,
basal ganglia, basilar cisterns, and ventricular system appear
within normal limits. No intracranial hemorrhage, mass lesion, or
acute CVA.

Vascular: Unremarkable

Skull: Unremarkable

Sinuses/Orbits: Unremarkable

Other: No supplemental non-categorized findings.
IMPRESSION: 1. No acute intracranial abnormalities.
2. Stable appearance of mildly advanced for age cerebral atrophy.
